# Patient Record
Sex: Female | Born: 1970 | Race: White | Hispanic: No | Smoking: Never smoker
Health system: Southern US, Community
[De-identification: ages and names within clinical notes are randomized; demographics above are authoritative.]

## PROBLEM LIST (undated history)

## (undated) DIAGNOSIS — J40 Bronchitis, not specified as acute or chronic: Secondary | ICD-10-CM

## (undated) DIAGNOSIS — J45909 Unspecified asthma, uncomplicated: Secondary | ICD-10-CM

## (undated) DIAGNOSIS — L03119 Cellulitis of unspecified part of limb: Secondary | ICD-10-CM

## (undated) DIAGNOSIS — E079 Disorder of thyroid, unspecified: Secondary | ICD-10-CM

## (undated) DIAGNOSIS — I10 Essential (primary) hypertension: Secondary | ICD-10-CM

## (undated) HISTORY — PX: CHOLECYSTECTOMY: SHX55

---

## 2015-05-07 ENCOUNTER — Encounter: Payer: Self-pay | Admitting: Emergency Medicine

## 2015-05-07 ENCOUNTER — Emergency Department: Payer: Self-pay

## 2015-05-07 ENCOUNTER — Emergency Department
Admission: EM | Admit: 2015-05-07 | Discharge: 2015-05-07 | Disposition: A | Discharge status: Home / Self Care | Payer: Self-pay | Attending: Emergency Medicine | Admitting: Emergency Medicine

## 2015-05-07 DIAGNOSIS — L03115 Cellulitis of right lower limb: Secondary | ICD-10-CM | POA: Insufficient documentation

## 2015-05-07 DIAGNOSIS — J45901 Unspecified asthma with (acute) exacerbation: Secondary | ICD-10-CM | POA: Insufficient documentation

## 2015-05-07 DIAGNOSIS — J40 Bronchitis, not specified as acute or chronic: Secondary | ICD-10-CM

## 2015-05-07 DIAGNOSIS — Z880 Allergy status to penicillin: Secondary | ICD-10-CM | POA: Insufficient documentation

## 2015-05-07 DIAGNOSIS — L03116 Cellulitis of left lower limb: Secondary | ICD-10-CM | POA: Insufficient documentation

## 2015-05-07 DIAGNOSIS — Z3202 Encounter for pregnancy test, result negative: Secondary | ICD-10-CM | POA: Insufficient documentation

## 2015-05-07 DIAGNOSIS — R609 Edema, unspecified: Secondary | ICD-10-CM

## 2015-05-07 DIAGNOSIS — I10 Essential (primary) hypertension: Secondary | ICD-10-CM | POA: Insufficient documentation

## 2015-05-07 HISTORY — DX: Unspecified asthma, uncomplicated: J45.909

## 2015-05-07 HISTORY — DX: Disorder of thyroid, unspecified: E07.9

## 2015-05-07 HISTORY — DX: Essential (primary) hypertension: I10

## 2015-05-07 LAB — BASIC METABOLIC PANEL
ANION GAP: 8 (ref 5–15)
BUN: 18 mg/dL (ref 6–20)
CHLORIDE: 102 mmol/L (ref 101–111)
CO2: 31 mmol/L (ref 22–32)
Calcium: 9.6 mg/dL (ref 8.9–10.3)
Creatinine, Ser: 1.25 mg/dL — ABNORMAL HIGH (ref 0.44–1.00)
GFR, EST AFRICAN AMERICAN: 60 mL/min — AB (ref >60)
GFR, EST NON AFRICAN AMERICAN: 52 mL/min — AB (ref >60)
Glucose, Bld: 134 mg/dL — ABNORMAL HIGH (ref 65–99)
POTASSIUM: 3.6 mmol/L (ref 3.5–5.1)
SODIUM: 141 mmol/L (ref 135–145)

## 2015-05-07 LAB — CBC WITH DIFFERENTIAL/PLATELET
BAND NEUTROPHILS: 3 %
BLASTS: 0 %
Basophils Absolute: 0 10*3/uL (ref 0–0.1)
Basophils Relative: 0 %
EOS ABS: 0.1 10*3/uL (ref 0–0.7)
Eosinophils Relative: 1 %
HCT: 37.1 % (ref 35.0–47.0)
HEMOGLOBIN: 11.9 g/dL — AB (ref 12.0–16.0)
LYMPHS PCT: 29 %
Lymphs Abs: 2.9 10*3/uL (ref 1.0–3.6)
MCH: 32.6 pg (ref 26.0–34.0)
MCHC: 32.1 g/dL (ref 32.0–36.0)
MCV: 101.5 fL — ABNORMAL HIGH (ref 80.0–100.0)
MYELOCYTES: 1 %
Metamyelocytes Relative: 2 %
Monocytes Absolute: 0.4 10*3/uL (ref 0.2–0.9)
Monocytes Relative: 4 %
NEUTROS PCT: 60 %
Neutro Abs: 6.6 10*3/uL — ABNORMAL HIGH (ref 1.4–6.5)
OTHER: 0 %
PROMYELOCYTES ABS: 0 %
Platelets: 209 10*3/uL (ref 150–440)
RBC: 3.65 MIL/uL — ABNORMAL LOW (ref 3.80–5.20)
RDW: 15.1 % — ABNORMAL HIGH (ref 11.5–14.5)
WBC: 10 10*3/uL (ref 3.6–11.0)
nRBC: 0 /100 WBC

## 2015-05-07 LAB — POCT PREGNANCY, URINE: PREG TEST UR: NEGATIVE

## 2015-05-07 LAB — TROPONIN I

## 2015-05-07 LAB — BRAIN NATRIURETIC PEPTIDE: B NATRIURETIC PEPTIDE 5: 108 pg/mL — AB (ref 0.0–100.0)

## 2015-05-07 MED ORDER — IOHEXOL 350 MG/ML SOLN
100.0000 mL | Freq: Once | INTRAVENOUS | Status: AC | PRN
  Administered 2015-05-07: 100 mL via INTRAVENOUS

## 2015-05-07 MED ORDER — IPRATROPIUM-ALBUTEROL 0.5-2.5 (3) MG/3ML IN SOLN
3.0000 mL | Freq: Once | RESPIRATORY_TRACT | Status: AC
  Administered 2015-05-07: 3 mL via RESPIRATORY_TRACT
  Filled 2015-05-07: qty 3

## 2015-05-07 MED ORDER — FUROSEMIDE 20 MG PO TABS
40.0000 mg | ORAL_TABLET | Freq: Every day | ORAL | Status: DC
Start: 2015-05-07 — End: 2015-05-19

## 2015-05-07 MED ORDER — IBUPROFEN 600 MG PO TABS
600.0000 mg | ORAL_TABLET | Freq: Once | ORAL | Status: AC
  Administered 2015-05-07: 600 mg via ORAL
  Filled 2015-05-07: qty 1

## 2015-05-07 MED ORDER — ALBUTEROL SULFATE HFA 108 (90 BASE) MCG/ACT IN AERS: 2.0000 | INHALATION_SPRAY | Freq: Four times a day (QID) | RESPIRATORY_TRACT | Status: DC | PRN

## 2015-05-07 MED ORDER — SULFAMETHOXAZOLE-TRIMETHOPRIM 800-160 MG PO TABS
1.0000 | ORAL_TABLET | Freq: Two times a day (BID) | ORAL | Status: DC
Start: 2015-05-07 — End: 2015-08-06

## 2015-05-07 MED ORDER — SULFAMETHOXAZOLE-TRIMETHOPRIM 800-160 MG PO TABS
1.0000 | ORAL_TABLET | Freq: Once | ORAL | Status: AC
  Administered 2015-05-07: 1 via ORAL
  Filled 2015-05-07: qty 1

## 2015-05-07 NOTE — ED Notes (Addendum)
Says she has swelling on both legs, but different on right side.  Also dry mouth.  Says right leg has heat in it. And swelling is spreading up to her belly.

## 2015-05-07 NOTE — ED Provider Notes (Addendum)
[facility]  I accepted care from Dr. Alphonzo Lemmings ____________________________________________ ECG read and interpreted by myself, Dr. Governor Rooks M.D. 62 bpm. Normal sinus rhythm. Nonspecific intraventricular conduction delay. Right axis deviation. Nonspecific T-wave     LABS (pertinent positives/negatives)  White blood count 10.0, hemoglobin 1.9 and platelet count 29 Basic medical panel significant for creatinine 1.25 and otherwise without significant abnormality BNP 108 and troponin less than 0.03   ____________________________________________    RADIOLOGY All xrays were viewed by me. Imaging interpreted by radiologist.  Chest 2 view:   IMPRESSION: 1. Mild diffuse peribronchial cuffing suggestive of acute bronchitis. 2. Mild cardiomegaly.  CT for PE:  IMPRESSION: Negative for pulmonary embolus. No acute cardiopulmonary disease.  Mild cardiomegaly.  Fatty infiltration liver. ____________________________________________   PROCEDURES  Procedure(s) performed: None  Critical Care performed: None  ____________________________________________   INITIAL IMPRESSION / ASSESSMENT AND PLAN / ED COURSE   Pertinent labs & imaging results that were available during my care of the patient were reviewed by me and considered in my medical decision making (see chart for details).   Although patient's symptoms did seem likely that be due to volume overload from CHF, she has no evidence of CHF on chest x-ray, no hypoxia, and her BNP is negative. She gives history that is more consistent with peripheral edema and bronchitis. I will go ahead and give her DuoNeb treatment here. Because she states it does dyspnea is very bad and out of proportion to her lung findings, I am did obtain a chest CT to rule out pulmonary embolism.  I was able to get the patient into rooms like examine her legs privately, and there is erythema, concerning for early cellulitis  bilateral shins. I will place her on Bactrim, as she requested a medication off the $4 list because she does not have insurance coverage. Her lungs feel a little bit better after albuterol, and will prescribe her an inhaler. Given that she does not have a history of asthma, and she feels a little better with the inhaler, I am going to hold off on any prednisone/steroids.  For her lower extremity edema, no evidence of CHF, I will prescribe 3 days of Lasix to help with diuresis.  She has no PCP, and I did refer her to see either [place] [facility] or [place] [facility].  CONSULTATIONS: None  Patient / Family / Caregiver informed of clinical course, medical decision-making process, and agree with plan.   I discussed return precautions, follow-up instructions, and discharged instructions with patient and/or family.     ____________________________________________   FINAL CLINICAL IMPRESSION(S) / ED DIAGNOSES  Final diagnoses:  Peripheral edema  Bronchitis  Cellulitis of leg without foot, left  Cellulitis of right leg without foot        Governor Rooks, MD 05/07/15 1835  Governor Rooks, MD 05/07/15 1956

## 2015-05-07 NOTE — ED Provider Notes (Signed)
[facility] Emergency Department Provider Note  ____________________________________________   I have reviewed the triage vital signs and the nursing notes.   HISTORY  Chief Complaint Leg Swelling    HPI Connie Spencer is a 45 y.o. female presents today complaining of shortness of breath. She also states she has bilateral leg swelling. She also states that her right pretibial region somewhat erythematous. She denies any fever or chills. She denies any nausea or vomiting. She denies any chest pain. Patient is morbidly obese. She states that she has had exertional dyspnea over the last 2-3 days. Her leg swelling is been worse over the last week. She used to be on a fluid pill but no longer is. She is not compliant with her medication. As of a history of hypertension thyroid and asthma. She states she's never been diagnosed with CHF however. She denies any cough. Nothing makes the swelling worsen nothing makes it better. She has had no cough. She denies any fever or history of PE or DVT or recent travel. Positive orthopnea.  Past Medical History  Diagnosis Date  . Hypertension   . Thyroid disease   . Asthma     There are no active problems to display for this patient.   Past Surgical History  Procedure Laterality Date  . Cholecystectomy      No current outpatient prescriptions on file.  Allergies Aspirin and Penicillins  No family history on file.  Social History Social History  Substance Use Topics  . Smoking status: Never Smoker   . Smokeless tobacco: None  . Alcohol Use: No    Review of Systems Constitutional: No fever/chills Eyes: No visual changes. ENT: No sore throat. No stiff neck no neck pain Cardiovascular: Denies chest pain. Respiratory: Positive shortness of breath. Positive orthopnea Gastrointestinal:   no vomiting.  No diarrhea.  No constipation. Genitourinary: Negative for dysuria. Musculoskeletal: Positive bilateral lower  extremity swelling Skin: Positive mild redness noted to the anterior part of the right leg she states Neurological: Negative for headaches, focal weakness or numbness. 10-point ROS otherwise negative.  ____________________________________________   PHYSICAL EXAM:  VITAL SIGNS: ED Triage Vitals  Enc Vitals Group     BP 05/07/15 1052 154/88 mmHg     Pulse Rate 05/07/15 1052 80     Resp 05/07/15 1052 18     Temp 05/07/15 1052 98 F (36.7 C)     Temp Source 05/07/15 1052 Oral     SpO2 05/07/15 1052 98 %     Weight 05/07/15 1052 300 lb (136.079 kg)     Height 05/07/15 1052 5' (1.524 m)     Head Cir --      Peak Flow --      Pain Score 05/07/15 1115 5     Pain Loc --      Pain Edu? --      Excl. in GC? --     Constitutional: Alert and oriented. Well appearing and in no acute distress. Eyes: Conjunctivae are normal. PERRL. EOMI. Head: Atraumatic. Nose: No congestion/rhinnorhea. Mouth/Throat: Mucous membranes are moist.  Oropharynx non-erythematous. Neck: No stridor.   Nontender with no meningismus Cardiovascular: Normal rate, regular rhythm. Grossly normal heart sounds.  Good peripheral circulation. Respiratory: Normal respiratory effort.  No retractions. Diminished in the bases with occasional rale Abdominal: Soft and nontender. No distention. No guarding no rebound Back:  There is no focal tenderness or step off there is no midline tenderness there are no lesions noted. there is  no CVA tenderness Musculoskeletal: No lower extremity tenderness. No joint effusions, no DVT signs strong distal pulses 3+ pitting bilateral symmetric edema with mild erythema noted to the pretibial region on the right leg Neurologic:  Normal speech and language. No gross focal neurologic deficits are appreciated.  Skin:  Skin is warm, dry and intact. Mild pretibial erythema with no fluctuance noted to the right Psychiatric: Mood and affect are normal. Speech and behavior are  normal.  ____________________________________________   LABS (all labs ordered are listed, but only abnormal results are displayed)  Labs Reviewed  CBC WITH DIFFERENTIAL/PLATELET  BASIC METABOLIC PANEL  BRAIN NATRIURETIC PEPTIDE  TROPONIN I   ____________________________________________  EKG  I personally interpreted any EKGs ordered by me or triage  ____________________________________________  RADIOLOGY  I reviewed any imaging ordered by me or triage that were performed during my shift ____________________________________________   PROCEDURES  Procedure(s) performed: None  Critical Care performed: None  ____________________________________________   INITIAL IMPRESSION / ASSESSMENT AND PLAN / ED COURSE  Pertinent labs & imaging results that were available during my care of the patient were reviewed by me and considered in my medical decision making (see chart for details).  Patient seen at the end of my shift and signed out prior to results. She has CHF symptoms with bilateral lower extremity swelling and edema and orthopnea and shortness of breath. She states she gets winded just moving around her house. She got short of breath just moving in the bed. Fortunately her oxygen saturation is reassuring. Low suspicion at this time for bilateral DVT or PE, patient used to be on a fluid pill and has significant pitting edema bilaterally. She likely will need to be admitted for echocardiogram and diuresis. There is some mild erythema to the right pretibial region. This will need better investigated when the patient is in a room and can be better addressed. At this time she is in the hallway. Signed out to Dr. Shaune Pollack at the end of my shift. ____________________________________________   FINAL CLINICAL IMPRESSION(S) / ED DIAGNOSES  Final diagnoses:  None      This chart was dictated using voice recognition software.  Despite best efforts to proofread,  errors can occur  which can change meaning.     Jeanmarie Plant, MD 05/07/15 1534

## 2015-05-07 NOTE — Discharge Instructions (Signed)
You were evaluated for shortness of breath and leg swelling with redness, and are being treated for bronchitis with an inhaler, peripheral/leg edema with furosemide, and early skin infection called cellulitis with Bactrim.  Return to the emergency department for any new or worsening condition including trouble breathing, shortness breath, fever, coughing, dizziness, weakness or numbness, fever, worsening leg swelling or redness, or any other symptoms concerning to you.   Cellulitis Cellulitis is an infection of the skin and the tissue beneath it. The infected area is usually red and tender. Cellulitis occurs most often in the arms and lower legs.  CAUSES  Cellulitis is caused by bacteria that enter the skin through cracks or cuts in the skin. The most common types of bacteria that cause cellulitis are staphylococci and streptococci. SIGNS AND SYMPTOMS   Redness and warmth.  Swelling.  Tenderness or pain.  Fever. DIAGNOSIS  Your health care provider can usually determine what is wrong based on a physical exam. Blood tests may also be done. TREATMENT  Treatment usually involves taking an antibiotic medicine. HOME CARE INSTRUCTIONS   Take your antibiotic medicine as directed by your health care provider. Finish the antibiotic even if you start to feel better.  Keep the infected arm or leg elevated to reduce swelling.  Apply a warm cloth to the affected area up to 4 times per day to relieve pain.  Take medicines only as directed by your health care provider.  Keep all follow-up visits as directed by your health care provider. SEEK MEDICAL CARE IF:   You notice red streaks coming from the infected area.  Your red area gets larger or turns dark in color.  Your bone or joint underneath the infected area becomes painful after the skin has healed.  Your infection returns in the same area or another area.  You notice a swollen bump in the infected area.  You develop new  symptoms.  You have a fever. SEEK IMMEDIATE MEDICAL CARE IF:   You feel very sleepy.  You develop vomiting or diarrhea.  You have a general ill feeling (malaise) with muscle aches and pains.   This information is not intended to replace advice given to you by your health care provider. Make sure you discuss any questions you have with your health care provider.   Document Released: 12/02/2004 Document Revised: 11/13/2014 Document Reviewed: 05/10/2011 Elsevier Interactive Patient Education 2016 Elsevier Inc.  Edema Edema is an abnormal buildup of fluids in your bodytissues. Edema is somewhatdependent on gravity to pull the fluid to the lowest place in your body. That makes the condition more common in the legs and thighs (lower extremities). Painless swelling of the feet and ankles is common and becomes more likely as you get older. It is also common in looser tissues, like around your eyes.  When the affected area is squeezed, the fluid may move out of that spot and leave a dent for a few moments. This dent is called pitting.  CAUSES  There are many possible causes of edema. Eating too much salt and being on your feet or sitting for a long time can cause edema in your legs and ankles. Hot weather may make edema worse. Common medical causes of edema include:  Heart failure.  Liver disease.  Kidney disease.  Weak blood vessels in your legs.  Cancer.  An injury.  Pregnancy.  Some medications.  Obesity. SYMPTOMS  Edema is usually painless.Your skin may look swollen or shiny.  DIAGNOSIS  Your health  care provider may be able to diagnose edema by asking about your medical history and doing a physical exam. You may need to have tests such as X-rays, an electrocardiogram, or blood tests to check for medical conditions that may cause edema.  TREATMENT  Edema treatment depends on the cause. If you have heart, liver, or kidney disease, you need the treatment appropriate for  these conditions. General treatment may include:  Elevation of the affected body part above the level of your heart.  Compression of the affected body part. Pressure from elastic bandages or support stockings squeezes the tissues and forces fluid back into the blood vessels. This keeps fluid from entering the tissues.  Restriction of fluid and salt intake.  Use of a water pill (diuretic). These medications are appropriate only for some types of edema. They pull fluid out of your body and make you urinate more often. This gets rid of fluid and reduces swelling, but diuretics can have side effects. Only use diuretics as directed by your health care provider. HOME CARE INSTRUCTIONS   Keep the affected body part above the level of your heart when you are lying down.   Do not sit still or stand for prolonged periods.   Do not put anything directly under your knees when lying down.  Do not wear constricting clothing or garters on your upper legs.   Exercise your legs to work the fluid back into your blood vessels. This may help the swelling go down.   Wear elastic bandages or support stockings to reduce ankle swelling as directed by your health care provider.   Eat a low-salt diet to reduce fluid if your health care provider recommends it.   Only take medicines as directed by your health care provider. SEEK MEDICAL CARE IF:   Your edema is not responding to treatment.  You have heart, liver, or kidney disease and notice symptoms of edema.  You have edema in your legs that does not improve after elevating them.   You have sudden and unexplained weight gain. SEEK IMMEDIATE MEDICAL CARE IF:   You develop shortness of breath or chest pain.   You cannot breathe when you lie down.  You develop pain, redness, or warmth in the swollen areas.   You have heart, liver, or kidney disease and suddenly get edema.  You have a fever and your symptoms suddenly get worse. MAKE SURE  YOU:   Understand these instructions.  Will watch your condition.  Will get help right away if you are not doing well or get worse.   This information is not intended to replace advice given to you by your health care provider. Make sure you discuss any questions you have with your health care provider.   Document Released: 02/22/2005 Document Revised: 03/15/2014 Document Reviewed: 12/15/2012 Elsevier Interactive Patient Education 2016 Elsevier Inc. Acute Bronchitis Bronchitis is inflammation of the airways that extend from the windpipe into the lungs (bronchi). The inflammation often causes mucus to develop. This leads to a cough, which is the most common symptom of bronchitis.  In acute bronchitis, the condition usually develops suddenly and goes away over time, usually in a couple weeks. Smoking, allergies, and asthma can make bronchitis worse. Repeated episodes of bronchitis may cause further lung problems.  CAUSES Acute bronchitis is most often caused by the same virus that causes a cold. The virus can spread from person to person (contagious) through coughing, sneezing, and touching contaminated objects. SIGNS AND SYMPTOMS   Cough.  Fever.   Coughing up mucus.   Body aches.   Chest congestion.   Chills.   Shortness of breath.   Sore throat.  DIAGNOSIS  Acute bronchitis is usually diagnosed through a physical exam. Your health care provider will also ask you questions about your medical history. Tests, such as chest X-rays, are sometimes done to rule out other conditions.  TREATMENT  Acute bronchitis usually goes away in a couple weeks. Oftentimes, no medical treatment is necessary. Medicines are sometimes given for relief of fever or cough. Antibiotic medicines are usually not needed but may be prescribed in certain situations. In some cases, an inhaler may be recommended to help reduce shortness of breath and control the cough. A cool mist vaporizer may also be  used to help thin bronchial secretions and make it easier to clear the chest.  HOME CARE INSTRUCTIONS  Get plenty of rest.   Drink enough fluids to keep your urine clear or pale yellow (unless you have a medical condition that requires fluid restriction). Increasing fluids may help thin your respiratory secretions (sputum) and reduce chest congestion, and it will prevent dehydration.   Take medicines only as directed by your health care provider.  If you were prescribed an antibiotic medicine, finish it all even if you start to feel better.  Avoid smoking and secondhand smoke. Exposure to cigarette smoke or irritating chemicals will make bronchitis worse. If you are a smoker, consider using nicotine gum or skin patches to help control withdrawal symptoms. Quitting smoking will help your lungs heal faster.   Reduce the chances of another bout of acute bronchitis by washing your hands frequently, avoiding people with cold symptoms, and trying not to touch your hands to your mouth, nose, or eyes.   Keep all follow-up visits as directed by your health care provider.  SEEK MEDICAL CARE IF: Your symptoms do not improve after 1 week of treatment.  SEEK IMMEDIATE MEDICAL CARE IF:  You develop an increased fever or chills.   You have chest pain.   You have severe shortness of breath.  You have bloody sputum.   You develop dehydration.  You faint or repeatedly feel like you are going to pass out.  You develop repeated vomiting.  You develop a severe headache. MAKE SURE YOU:   Understand these instructions.  Will watch your condition.  Will get help right away if you are not doing well or get worse.   This information is not intended to replace advice given to you by your health care provider. Make sure you discuss any questions you have with your health care provider.   Document Released: 04/01/2004 Document Revised: 03/15/2014 Document Reviewed: 08/15/2012 Elsevier  Interactive Patient Education [employer]

## 2015-05-07 NOTE — ED Notes (Signed)
Patient transported to CT 

## 2015-05-08 ENCOUNTER — Telehealth: Payer: Self-pay | Admitting: Emergency Medicine

## 2015-05-19 ENCOUNTER — Emergency Department: Payer: Self-pay

## 2015-05-19 ENCOUNTER — Emergency Department
Admission: EM | Admit: 2015-05-19 | Discharge: 2015-05-19 | Disposition: A | Discharge status: Home / Self Care | Payer: Self-pay | Attending: Emergency Medicine | Admitting: Emergency Medicine

## 2015-05-19 ENCOUNTER — Encounter: Payer: Self-pay | Admitting: Emergency Medicine

## 2015-05-19 DIAGNOSIS — I10 Essential (primary) hypertension: Secondary | ICD-10-CM | POA: Insufficient documentation

## 2015-05-19 DIAGNOSIS — R609 Edema, unspecified: Secondary | ICD-10-CM

## 2015-05-19 DIAGNOSIS — L03119 Cellulitis of unspecified part of limb: Secondary | ICD-10-CM | POA: Insufficient documentation

## 2015-05-19 DIAGNOSIS — Z880 Allergy status to penicillin: Secondary | ICD-10-CM | POA: Insufficient documentation

## 2015-05-19 DIAGNOSIS — R600 Localized edema: Secondary | ICD-10-CM | POA: Insufficient documentation

## 2015-05-19 DIAGNOSIS — J45901 Unspecified asthma with (acute) exacerbation: Secondary | ICD-10-CM | POA: Insufficient documentation

## 2015-05-19 LAB — CBC
HEMATOCRIT: 33.7 % — AB (ref 35.0–47.0)
HEMOGLOBIN: 11.1 g/dL — AB (ref 12.0–16.0)
MCH: 33.7 pg (ref 26.0–34.0)
MCHC: 32.9 g/dL (ref 32.0–36.0)
MCV: 102.3 fL — ABNORMAL HIGH (ref 80.0–100.0)
Platelets: 286 10*3/uL (ref 150–440)
RBC: 3.29 MIL/uL — ABNORMAL LOW (ref 3.80–5.20)
RDW: 15.5 % — AB (ref 11.5–14.5)
WBC: 9.7 10*3/uL (ref 3.6–11.0)

## 2015-05-19 LAB — COMPREHENSIVE METABOLIC PANEL
ALBUMIN: 4.4 g/dL (ref 3.5–5.0)
ALT: 117 U/L — ABNORMAL HIGH (ref 14–54)
AST: 103 U/L — AB (ref 15–41)
Alkaline Phosphatase: 320 U/L — ABNORMAL HIGH (ref 38–126)
Anion gap: 8 (ref 5–15)
BILIRUBIN TOTAL: 0.7 mg/dL (ref 0.3–1.2)
BUN: 32 mg/dL — AB (ref 6–20)
CO2: 30 mmol/L (ref 22–32)
Calcium: 9.6 mg/dL (ref 8.9–10.3)
Chloride: 98 mmol/L — ABNORMAL LOW (ref 101–111)
Creatinine, Ser: 1.6 mg/dL — ABNORMAL HIGH (ref 0.44–1.00)
GFR calc Af Amer: 44 mL/min — ABNORMAL LOW (ref >60)
GFR calc non Af Amer: 38 mL/min — ABNORMAL LOW (ref >60)
GLUCOSE: 192 mg/dL — AB (ref 65–99)
POTASSIUM: 4.2 mmol/L (ref 3.5–5.1)
Sodium: 136 mmol/L (ref 135–145)
TOTAL PROTEIN: 8.3 g/dL — AB (ref 6.5–8.1)

## 2015-05-19 LAB — TROPONIN I: Troponin I: 0.03 ng/mL (ref <0.031)

## 2015-05-19 MED ORDER — POTASSIUM CHLORIDE ER 10 MEQ PO TBCR: 10.0000 meq | EXTENDED_RELEASE_TABLET | Freq: Every day | ORAL | Status: DC

## 2015-05-19 MED ORDER — FUROSEMIDE 10 MG/ML IJ SOLN
60.0000 mg | Freq: Once | INTRAMUSCULAR | Status: AC
  Administered 2015-05-19: 60 mg via INTRAVENOUS
  Filled 2015-05-19: qty 8

## 2015-05-19 MED ORDER — FUROSEMIDE 40 MG PO TABS: 40.0000 mg | ORAL_TABLET | Freq: Every day | ORAL | Status: DC

## 2015-05-19 MED ORDER — HYDROCODONE-ACETAMINOPHEN 5-325 MG PO TABS: 1.0000 | ORAL_TABLET | ORAL | Status: DC | PRN

## 2015-05-19 MED ORDER — MORPHINE SULFATE (PF) 4 MG/ML IV SOLN
4.0000 mg | Freq: Once | INTRAVENOUS | Status: AC
  Administered 2015-05-19: 4 mg via INTRAVENOUS
  Filled 2015-05-19: qty 1

## 2015-05-19 MED ORDER — CEPHALEXIN 500 MG PO CAPS: 500.0000 mg | ORAL_CAPSULE | Freq: Three times a day (TID) | ORAL | Status: DC

## 2015-05-19 MED ORDER — ONDANSETRON HCL 4 MG/2ML IJ SOLN
4.0000 mg | Freq: Once | INTRAMUSCULAR | Status: AC
  Administered 2015-05-19: 4 mg via INTRAVENOUS
  Filled 2015-05-19: qty 2

## 2015-05-19 NOTE — ED Notes (Signed)
AAOx3.  Skin warm and dry.  NAD 

## 2015-05-19 NOTE — ED Notes (Addendum)
Pt presents with bilateral lower leg swelling since last week, has been seen here for same recently. Also c/o shortness of breath.

## 2015-05-19 NOTE — ED Notes (Signed)
Says leg swelling since she was here last  Time.  Says she cannot get a doctor due to her friend she llives with will not sign papers for [facility].  Says no change in redness.  Pt has very swollen legs bilaterally.  Pedal pulses bilaterally are good.  Denies  Sob right now. lungd sound cleasr

## 2015-05-19 NOTE — ED Notes (Signed)
2 L O2 placed on patient.

## 2015-05-19 NOTE — ED Notes (Signed)
Patient to US via stretcher.  AAOx3.  Skin warm and dry.  No SOB/ DOE.

## 2015-05-19 NOTE — Discharge Instructions (Signed)
Cellulitis °Cellulitis is an infection of the skin and the tissue beneath it. The infected area is usually red and tender. Cellulitis occurs most often in the arms and lower legs.  °CAUSES  °Cellulitis is caused by bacteria that enter the skin through cracks or cuts in the skin. The most common types of bacteria that cause cellulitis are staphylococci and streptococci. °SIGNS AND SYMPTOMS  °· Redness and warmth. °· Swelling. °· Tenderness or pain. °· Fever. °DIAGNOSIS  °Your health care provider can usually determine what is wrong based on a physical exam. Blood tests may also be done. °TREATMENT  °Treatment usually involves taking an antibiotic medicine. °HOME CARE INSTRUCTIONS  °· Take your antibiotic medicine as directed by your health care provider. Finish the antibiotic even if you start to feel better. °· Keep the infected arm or leg elevated to reduce swelling. °· Apply a warm cloth to the affected area up to 4 times per day to relieve pain. °· Take medicines only as directed by your health care provider. °· Keep all follow-up visits as directed by your health care provider. °SEEK MEDICAL CARE IF:  °· You notice red streaks coming from the infected area. °· Your red area gets larger or turns dark in color. °· Your bone or joint underneath the infected area becomes painful after the skin has healed. °· Your infection returns in the same area or another area. °· You notice a swollen bump in the infected area. °· You develop new symptoms. °· You have a fever. °SEEK IMMEDIATE MEDICAL CARE IF:  °· You feel very sleepy. °· You develop vomiting or diarrhea. °· You have a general ill feeling (malaise) with muscle aches and pains. °  °This information is not intended to replace advice given to you by your health care provider. Make sure you discuss any questions you have with your health care provider. °  °Document Released: 12/02/2004 Document Revised: 11/13/2014 Document Reviewed: 05/10/2011 °Elsevier Interactive  Patient Education ©2016 Elsevier Inc. ° °Peripheral Edema °You have swelling in your legs (peripheral edema). This swelling is due to excess accumulation of salt and water in your body. Edema may be a sign of heart, kidney or liver disease, or a side effect of a medication. It may also be due to problems in the leg veins. Elevating your legs and using special support stockings may be very helpful, if the cause of the swelling is due to poor venous circulation. Avoid long periods of standing, whatever the cause. °Treatment of edema depends on identifying the cause. Chips, pretzels, pickles and other salty foods should be avoided. Restricting salt in your diet is almost always needed. Water pills (diuretics) are often used to remove the excess salt and water from your body via urine. These medicines prevent the kidney from reabsorbing sodium. This increases urine flow. °Diuretic treatment may also result in lowering of potassium levels in your body. Potassium supplements may be needed if you have to use diuretics daily. Daily weights can help you keep track of your progress in clearing your edema. You should call your caregiver for follow up care as recommended. °SEEK IMMEDIATE MEDICAL CARE IF:  °· You have increased swelling, pain, redness, or heat in your legs. °· You develop shortness of breath, especially when lying down. °· You develop chest or abdominal pain, weakness, or fainting. °· You have a fever. °  °This information is not intended to replace advice given to you by your health care provider. Make sure you discuss   any questions you have with your health care provider. °  °Document Released: 04/01/2004 Document Revised: 05/17/2011 Document Reviewed: 09/04/2014 °Elsevier Interactive Patient Education ©2016 Elsevier Inc. ° °

## 2015-05-19 NOTE — ED Notes (Addendum)
Patient discharged 1405.

## 2015-05-19 NOTE — ED Provider Notes (Signed)
[facility] Emergency Department Provider Note  Time seen: 11:53 AM  I have reviewed the triage vital signs and the nursing notes.   HISTORY  Chief Complaint Leg Swelling    HPI Connie Spencer is a 45 y.o. female with a past medical history of hypertension, asthma presents the emergency department with lower extremity swelling of both legs, with mild shortness breath. According to the patient for the past several weeks she has had increased lower extremity edema and tenderness to her lower extremities. Also complaining of several weeks of shortness of breath. Patient was seen in the emergency department for the same complaint 10 days ago at that time had a negative workup including a CT angiography of the chest. Patient states she is supposed to be on Lasix but could not afford it so she has been off of it for many years. Patient states she no longer has a primary care physician due to financial reasons. Denies any chest pain. States her shortness of breath is mild. Leg pain is moderate to severe dull aching pain in both lower extremities.     Past Medical History  Diagnosis Date  . Hypertension   . Thyroid disease   . Asthma     There are no active problems to display for this patient.   Past Surgical History  Procedure Laterality Date  . Cholecystectomy      Current Outpatient Rx  Name  Route  Sig  Dispense  Refill  . albuterol (PROVENTIL HFA;VENTOLIN HFA) 108 (90 Base) MCG/ACT inhaler   Inhalation   Inhale 2 puffs into the lungs every 6 (six) hours as needed for wheezing or shortness of breath.   1 Inhaler   0   . furosemide (LASIX) 20 MG tablet   Oral   Take 2 tablets (40 mg total) by mouth daily. Patient not taking: Reported on 05/19/2015   3 tablet   0   . sulfamethoxazole-trimethoprim (BACTRIM DS,SEPTRA DS) 800-160 MG tablet   Oral   Take 1 tablet by mouth 2 (two) times daily. Patient not taking: Reported on 05/19/2015   20 tablet   0      Allergies Aspirin and Penicillins  No family history on file.  Social History Social History  Substance Use Topics  . Smoking status: Never Smoker   . Smokeless tobacco: None  . Alcohol Use: No    Review of Systems Constitutional: Negative for fever. Cardiovascular: Negative for chest pain. Respiratory: Positive for shortness of breath. Gastrointestinal: Negative for abdominal pain Musculoskeletal: Positive for bilateral lower extremity swelling and pain. Skin: Mild redness of bilateral lower extremities Neurological: Negative for headache 10-point ROS otherwise negative.  ____________________________________________   PHYSICAL EXAM:  VITAL SIGNS: ED Triage Vitals  Enc Vitals Group     BP 05/19/15 1029 141/80 mmHg     Pulse Rate 05/19/15 1029 74     Resp 05/19/15 1029 20     Temp 05/19/15 1029 97.9 F (36.6 C)     Temp Source 05/19/15 1029 Oral     SpO2 05/19/15 1029 90 %     Weight 05/19/15 1029 300 lb (136.079 kg)     Height 05/19/15 1029 5' (1.524 m)     Head Cir --      Peak Flow --      Pain Score 05/19/15 1031 8     Pain Loc --      Pain Edu? --      Excl. in GC? --  Constitutional: Alert and oriented. Well appearing and in no distress. Eyes: Normal exam ENT   Head: Normocephalic and atraumatic.   Mouth/Throat: Mucous membranes are moist. Cardiovascular: Normal rate, regular rhythm.  Respiratory: Normal respiratory effort without tachypnea nor retractions. Breath sounds are clear  Gastrointestinal: Soft and nontender. No distention.  Musculoskeletal: Patient with 2-3+ lower extremity edema bilateral lower extremities. Mild erythema. Moderate tenderness palpation bilateral lower extremities. Neurologic:  Normal speech and language. No gross focal neurologic deficits  Skin:  Skin is warm, dry and intact.  Psychiatric: Mood and affect are normal. Speech and behavior are normal.    ____________________________________________   RADIOLOGY  Ultrasound negative for DVT Chest x-ray negative  ____________________________________________   INITIAL IMPRESSION / ASSESSMENT AND PLAN / ED COURSE  Pertinent labs & imaging results that were available during my care of the patient were reviewed by me and considered in my medical decision making (see chart for details).  Patient with bilateral lower extremity edema, mild shortness of breath. Patient is a moderate tenderness palpation of the bilateral lower extremities with mild erythema, suggestive of possible cellulitis versus pain from chronic peripheral edema. Patient states she is supposed to be on Lasix chronically but cannot afford it so she has been off of it for many years. Patient was given 3 days of Lasix during her last visit, states she took them but it did not help. We will check labs, ultrasound of bilateral lower extremities, chest x-ray. Overall the patient appears well, no distress.  Ultrasound negative for DVT. Chest x-ray shows no acute findings. We'll place the patient on Lasix, as well as cover with Keflex for possible cellulitis. We'll place the patient on short course of pain medication and referred to [facility]. Patient agreeable to this plan.  ____________________________________________   FINAL CLINICAL IMPRESSION(S) / ED DIAGNOSES  Peripheral edema Cellulitis   Minna Antis, MD 05/19/15 1258

## 2015-08-02 ENCOUNTER — Emergency Department: Payer: Self-pay

## 2015-08-02 ENCOUNTER — Inpatient Hospital Stay
Admission: EM | Admit: 2015-08-02 | Discharge: 2015-08-06 | DRG: 291 | Disposition: A | Discharge status: Home / Self Care | Payer: Self-pay | Attending: Internal Medicine | Admitting: Internal Medicine

## 2015-08-02 ENCOUNTER — Encounter: Payer: Self-pay | Admitting: Emergency Medicine

## 2015-08-02 DIAGNOSIS — J449 Chronic obstructive pulmonary disease, unspecified: Secondary | ICD-10-CM | POA: Diagnosis present

## 2015-08-02 DIAGNOSIS — I130 Hypertensive heart and chronic kidney disease with heart failure and stage 1 through stage 4 chronic kidney disease, or unspecified chronic kidney disease: Principal | ICD-10-CM | POA: Diagnosis present

## 2015-08-02 DIAGNOSIS — J440 Chronic obstructive pulmonary disease with acute lower respiratory infection: Secondary | ICD-10-CM | POA: Diagnosis present

## 2015-08-02 DIAGNOSIS — Z880 Allergy status to penicillin: Secondary | ICD-10-CM

## 2015-08-02 DIAGNOSIS — E039 Hypothyroidism, unspecified: Secondary | ICD-10-CM | POA: Diagnosis present

## 2015-08-02 DIAGNOSIS — Z886 Allergy status to analgesic agent status: Secondary | ICD-10-CM

## 2015-08-02 DIAGNOSIS — I5033 Acute on chronic diastolic (congestive) heart failure: Secondary | ICD-10-CM | POA: Diagnosis present

## 2015-08-02 DIAGNOSIS — R609 Edema, unspecified: Secondary | ICD-10-CM

## 2015-08-02 DIAGNOSIS — Z9114 Patient's other noncompliance with medication regimen: Secondary | ICD-10-CM

## 2015-08-02 DIAGNOSIS — R0602 Shortness of breath: Secondary | ICD-10-CM

## 2015-08-02 DIAGNOSIS — J9601 Acute respiratory failure with hypoxia: Secondary | ICD-10-CM | POA: Diagnosis present

## 2015-08-02 DIAGNOSIS — Z6841 Body Mass Index (BMI) 40.0 and over, adult: Secondary | ICD-10-CM

## 2015-08-02 DIAGNOSIS — I509 Heart failure, unspecified: Secondary | ICD-10-CM

## 2015-08-02 DIAGNOSIS — G4733 Obstructive sleep apnea (adult) (pediatric): Secondary | ICD-10-CM | POA: Diagnosis present

## 2015-08-02 DIAGNOSIS — R600 Localized edema: Secondary | ICD-10-CM

## 2015-08-02 DIAGNOSIS — R0902 Hypoxemia: Secondary | ICD-10-CM | POA: Diagnosis present

## 2015-08-02 DIAGNOSIS — J209 Acute bronchitis, unspecified: Secondary | ICD-10-CM | POA: Diagnosis present

## 2015-08-02 HISTORY — DX: Cellulitis of unspecified part of limb: L03.119

## 2015-08-02 HISTORY — DX: Bronchitis, not specified as acute or chronic: J40

## 2015-08-02 LAB — CBC WITH DIFFERENTIAL/PLATELET
Basophils Absolute: 0.1 10*3/uL (ref 0–0.1)
Basophils Relative: 1 %
EOS PCT: 3 %
Eosinophils Absolute: 0.3 10*3/uL (ref 0–0.7)
HCT: 34.9 % — ABNORMAL LOW (ref 35.0–47.0)
Hemoglobin: 11.5 g/dL — ABNORMAL LOW (ref 12.0–16.0)
LYMPHS ABS: 2.1 10*3/uL (ref 1.0–3.6)
Lymphocytes Relative: 20 %
MCH: 33.5 pg (ref 26.0–34.0)
MCHC: 32.9 g/dL (ref 32.0–36.0)
MCV: 101.6 fL — AB (ref 80.0–100.0)
MONO ABS: 0.5 10*3/uL (ref 0.2–0.9)
Monocytes Relative: 5 %
NEUTROS PCT: 71 %
Neutro Abs: 7.6 10*3/uL — ABNORMAL HIGH (ref 1.4–6.5)
PLATELETS: 144 10*3/uL — AB (ref 150–440)
RBC: 3.44 MIL/uL — AB (ref 3.80–5.20)
RDW: 16.1 % — AB (ref 11.5–14.5)
WBC: 10.6 10*3/uL (ref 3.6–11.0)

## 2015-08-02 LAB — BASIC METABOLIC PANEL
Anion gap: 11 (ref 5–15)
BUN: 31 mg/dL — ABNORMAL HIGH (ref 6–20)
CO2: 27 mmol/L (ref 22–32)
Calcium: 9.8 mg/dL (ref 8.9–10.3)
Chloride: 99 mmol/L — ABNORMAL LOW (ref 101–111)
Creatinine, Ser: 1.6 mg/dL — ABNORMAL HIGH (ref 0.44–1.00)
GFR calc Af Amer: 44 mL/min — ABNORMAL LOW (ref >60)
GFR calc non Af Amer: 38 mL/min — ABNORMAL LOW (ref >60)
Glucose, Bld: 145 mg/dL — ABNORMAL HIGH (ref 65–99)
Potassium: 4.3 mmol/L (ref 3.5–5.1)
Sodium: 137 mmol/L (ref 135–145)

## 2015-08-02 LAB — URINALYSIS COMPLETE WITH MICROSCOPIC (ARMC ONLY)
Bilirubin Urine: NEGATIVE
Glucose, UA: NEGATIVE mg/dL
Hgb urine dipstick: NEGATIVE
Ketones, ur: NEGATIVE mg/dL
Leukocytes, UA: NEGATIVE
Nitrite: NEGATIVE
PROTEIN: NEGATIVE mg/dL
Specific Gravity, Urine: 1.006 (ref 1.005–1.030)
pH: 5 (ref 5.0–8.0)

## 2015-08-02 LAB — TSH: TSH: 78.727 u[IU]/mL — ABNORMAL HIGH (ref 0.350–4.500)

## 2015-08-02 LAB — TROPONIN I: Troponin I: <0.03 ng/mL (ref <0.031)

## 2015-08-02 LAB — BRAIN NATRIURETIC PEPTIDE: B Natriuretic Peptide: 22 pg/mL (ref 0.0–100.0)

## 2015-08-02 MED ORDER — IPRATROPIUM-ALBUTEROL 0.5-2.5 (3) MG/3ML IN SOLN
3.0000 mL | Freq: Once | RESPIRATORY_TRACT | Status: AC
  Administered 2015-08-02: 3 mL via RESPIRATORY_TRACT
  Filled 2015-08-02: qty 3

## 2015-08-02 MED ORDER — LEVOTHYROXINE SODIUM 75 MCG PO TABS
75.0000 ug | ORAL_TABLET | Freq: Every day | ORAL | Status: DC
  Administered 2015-08-03: 75 ug via ORAL
  Filled 2015-08-02: qty 1

## 2015-08-02 MED ORDER — HYDROCODONE-ACETAMINOPHEN 5-325 MG PO TABS
1.0000 | ORAL_TABLET | Freq: Three times a day (TID) | ORAL | Status: DC | PRN
  Administered 2015-08-02 – 2015-08-06 (×8): 1 via ORAL
  Filled 2015-08-02 (×8): qty 1

## 2015-08-02 MED ORDER — ACETAMINOPHEN 650 MG RE SUPP: 650.0000 mg | Freq: Four times a day (QID) | RECTAL | Status: DC | PRN

## 2015-08-02 MED ORDER — ENOXAPARIN SODIUM 40 MG/0.4ML ~~LOC~~ SOLN
40.0000 mg | Freq: Two times a day (BID) | SUBCUTANEOUS | Status: DC
  Administered 2015-08-02 – 2015-08-06 (×8): 40 mg via SUBCUTANEOUS
  Filled 2015-08-02 (×8): qty 0.4

## 2015-08-02 MED ORDER — ACETAMINOPHEN 325 MG PO TABS
650.0000 mg | ORAL_TABLET | Freq: Four times a day (QID) | ORAL | Status: DC | PRN
  Administered 2015-08-02 – 2015-08-03 (×2): 650 mg via ORAL
  Administered 2015-08-03: 325 mg via ORAL
  Administered 2015-08-05 (×3): 650 mg via ORAL
  Filled 2015-08-02 (×2): qty 2
  Filled 2015-08-02: qty 1
  Filled 2015-08-02 (×3): qty 2

## 2015-08-02 MED ORDER — FUROSEMIDE 10 MG/ML IJ SOLN
40.0000 mg | Freq: Once | INTRAMUSCULAR | Status: AC
  Administered 2015-08-02: 40 mg via INTRAVENOUS
  Filled 2015-08-02: qty 4

## 2015-08-02 MED ORDER — ONDANSETRON HCL 4 MG PO TABS: 4.0000 mg | ORAL_TABLET | Freq: Four times a day (QID) | ORAL | Status: DC | PRN

## 2015-08-02 MED ORDER — FUROSEMIDE 40 MG PO TABS
40.0000 mg | ORAL_TABLET | Freq: Every day | ORAL | Status: DC
  Administered 2015-08-03: 40 mg via ORAL
  Filled 2015-08-02: qty 1

## 2015-08-02 MED ORDER — ONDANSETRON HCL 4 MG/2ML IJ SOLN: 4.0000 mg | Freq: Four times a day (QID) | INTRAMUSCULAR | Status: DC | PRN

## 2015-08-02 NOTE — Progress Notes (Signed)
TSH 78 All her symptoms are s/o primary hypothyroidsmi (untreated0 Start po synthroid 75 mcg

## 2015-08-02 NOTE — ED Notes (Signed)
Increased leg edema x 1 month. States has has trouble with edema x 6 months. Edema noted generalized, face even appears slightly edematous. Speaking full sentences.

## 2015-08-02 NOTE — ED Notes (Signed)
O2 sat ranging between 85-92 on RA. Pt does not usually wear O2 at home. Sat 95% on 1L n/c. MD notified.

## 2015-08-02 NOTE — ED Notes (Signed)
Pt placed on 2L n/c for SpO2 90% at rest.

## 2015-08-02 NOTE — ED Notes (Signed)
Pt reporting SOB and cough. Leg redness, facial and leg swelling X1 month. Pt reported recent diagnosis of cellulitis with abx course finished. Pt reports taking lasix at home, but not had any for weeks with >50lb weight gain.

## 2015-08-02 NOTE — ED Notes (Signed)
Family at bedside. 

## 2015-08-02 NOTE — ED Provider Notes (Addendum)
[name] Note   ____________________________________________  Time seen: I have reviewed the triage vital signs and the triage nursing note.  HISTORY  Chief Complaint Leg Swelling   Historian Patient  HPI Connie Spencer is a 45 y.o. female is here for evaluation of swelling of her lower extremities bilaterally, up to her abdomen and she is also complaining of shortness of breath. She has some chest pressure. She states that she has chronic bronchitis. No recent change in sputum or fevers.  No primary care doctor.  She has been taking 40mg  lasix daily from a prior ER visit.  She states she's gotten worse over 1 month.    Past Medical History  Diagnosis Date  . Hypertension   . Thyroid disease   . Asthma     There are no active problems to display for this patient.   Past Surgical History  Procedure Laterality Date  . Cholecystectomy      Current Outpatient Rx  Name  Route  Sig  Dispense  Refill  . albuterol (PROVENTIL HFA;VENTOLIN HFA) 108 (90 Base) MCG/ACT inhaler   Inhalation   Inhale 2 puffs into the lungs every 6 (six) hours as needed for wheezing or shortness of breath.   1 Inhaler   0   . furosemide (LASIX) 40 MG tablet   Oral   Take 1 tablet (40 mg total) by mouth daily.   30 tablet   0   . cephALEXin (KEFLEX) 500 MG capsule   Oral   Take 1 capsule (500 mg total) by mouth 3 (three) times daily.   30 capsule   0   . HYDROcodone-acetaminophen (NORCO/VICODIN) 5-325 MG tablet   Oral   Take 1 tablet by mouth every 4 (four) hours as needed for moderate pain.   20 tablet   0   . potassium chloride (KLOR-CON 10) 10 MEQ tablet   Oral   Take 1 tablet (10 mEq total) by mouth daily.   30 tablet   0   . sulfamethoxazole-trimethoprim (BACTRIM DS,SEPTRA DS) 800-160 MG tablet   Oral   Take 1 tablet by mouth 2 (two) times daily. Patient not taking: Reported on 05/19/2015   20 tablet   0      Allergies Aspirin and Penicillins  No family history on file.  Social History Social History  Substance Use Topics  . Smoking status: Never Smoker   . Smokeless tobacco: None  . Alcohol Use: No    Review of Systems  Constitutional: Negative for fever. Eyes: Negative for visual changes. ENT: Negative for sore throat. Cardiovascular: Pos for chest pressure, no pleuritic chest pain Respiratory: Pos for shortness of breath. Gastrointestinal: Negative for abdominal pain, vomiting and diarrhea. Genitourinary: Negative for dysuria. Musculoskeletal: Negative for back pain. Skin: Negative for rash. Neurological: Negative for headache. 10 point Review of Systems otherwise negative ____________________________________________   PHYSICAL EXAM:  VITAL SIGNS: ED Triage Vitals  Enc Vitals Group     BP 08/02/15 1014 144/72 mmHg     Pulse Rate 08/02/15 1014 67     Resp 08/02/15 1014 20     Temp 08/02/15 1014 97.6 F (36.4 C)     Temp Source 08/02/15 1014 Oral     SpO2 08/02/15 1014 94 %     Weight 08/02/15 1014 333 lb (151.048 kg)     Height 08/02/15 1014 5' (1.524 m)     Head Cir --      Peak Flow --  Pain Score 08/02/15 1016 10     Pain Loc --      Pain Edu? --      Excl. in GC? --      Constitutional: Alert and oriented. Well appearing and in no distress. HEENT   Head: Normocephalic and atraumatic.      Eyes: Conjunctivae are normal. PERRL. Normal extraocular movements.      Ears:         Nose: No congestion/rhinnorhea.   Mouth/Throat: Mucous membranes are moist.   Neck: No stridor. Cardiovascular/Chest: Normal rate, regular rhythm.  No murmurs, rubs, or gallops. Respiratory: Normal respiratory effort without tachypnea nor retractions. Decreased breath sounds throughout. Very mild and expiratory wheezing. Gastrointestinal: Soft. No guarding, no rebound. Nontender. Obese, tight abdomen.  Genitourinary/rectal:Deferred Musculoskeletal: Nontender with  normal range of motion in all extremities. No joint effusions. Extremely swollen bilateral lower show any pitting edema. Some soreness of bilateral lower extremities at the tight skin. Some erythema without cellulitic-looking. Neurologic:  Normal speech and language. No gross or focal neurologic deficits are appreciated. Skin:  Skin is warm, dry and intact. No rash noted. Psychiatric: Mood and affect are normal. Speech and behavior are normal. Patient exhibits appropriate insight and judgment.  ____________________________________________   EKG I, Governor Rooks, MD, the attending physician have personally viewed and interpreted all ECGs.  59 bpm. Normal sinus rhythm. Narrow QRS. Normal axis. Nonspecific ST and T-wave ____________________________________________  LABS (pertinent positives/negatives)  Labs Reviewed  CBC WITH DIFFERENTIAL/PLATELET - Abnormal; Notable for the following:    RBC 3.44 (*)    Hemoglobin 11.5 (*)    HCT 34.9 (*)    MCV 101.6 (*)    RDW 16.1 (*)    Platelets 144 (*)    Neutro Abs 7.6 (*)    All other components within normal limits  BASIC METABOLIC PANEL - Abnormal; Notable for the following:    Chloride 99 (*)    Glucose, Bld 145 (*)    BUN 31 (*)    Creatinine, Ser 1.60 (*)    GFR calc non Af Amer 38 (*)    GFR calc Af Amer 44 (*)    All other components within normal limits  TROPONIN I  BRAIN NATRIURETIC PEPTIDE    ____________________________________________  RADIOLOGY All Xrays were viewed by me. Imaging interpreted by Radiologist.  Chest:  IMPRESSION: 1. Mild diffuse peribronchial cuffing which appears to be chronic, and likely related to underlying reactive airway disease. 2. Mild cardiomegaly. __________________________________________  PROCEDURES  Procedure(s) performed: None  Critical Care performed: None  ____________________________________________   ED COURSE / ASSESSMENT AND PLAN  Pertinent labs & imaging results that  were available during my care of the patient were reviewed by me and considered in my medical decision making (see chart for details).   Patient appears clinically in volume overload state.  She does not report a history of CHF, but she certainly has extreme peripheral edema which appears to be up-to-date abdomen show any shortness of breath with a room air O2 sat of 91%. She does have a history of "chronic bronchitis "and has a little bit of an extra auditory wheezing, but I think that her volume overload state is probably more concerning to her respiratory complaints.   Chest x-ray does not show pulmonary edema, however she does have cardiomegaly. Clinically she certainly seems to be having congestive heart failure and with her low O2 sat, worsening on 40 mg Lasix at home, and borderline creatinine, making IV diuresis  in the hospital the safest option at this point.  The minimal leg redness I think is due to edema, rather than acute cellulitis.  She did have her legs ultrasounded recently in march for similar symptoms without acute DVT.  Her legs have progressively been worsening in terms of overall swelling, but so has the swelling throughout her body.  CONSULTATIONS:  Hospitalist for admission.   Patient / Family / Caregiver informed of clinical course, medical decision-making process, and agree with plan.   ___________________________________________   FINAL CLINICAL IMPRESSION(S) / ED DIAGNOSES   Final diagnoses:  Acute congestive heart failure, unspecified congestive heart failure type (HCC)  Peripheral edema              Note: This dictation was prepared with Dragon dictation. Any transcriptional errors that result from this process are unintentional   Governor Rooksebecca Latori Beggs, MD 08/02/15 1423  Governor Rooksebecca Ardit Danh, MD 08/02/15 [phone]

## 2015-08-02 NOTE — H&P (Addendum)
[facility] [place] at [facility]   PATIENT NAME: Connie Spencer    MR#:  [id number]  DATE OF BIRTH:  05-29-1970  DATE OF ADMISSION:  08/02/2015  PRIMARY CARE PHYSICIAN: No PCP Per Patient   REQUESTING/REFERRING PHYSICIAN: dr Shaune Pollack  CHIEF COMPLAINT:   Weight gain and leg edema HISTORY OF PRESENT ILLNESS:  Connie Spencer  is a 45 y.o. female with a known history of Hypothyroidism (not on any meds)Comes to the emergency room on and off several times with increasing weight gain of 50 pounds, dry skin, leg edema. Patient did get treated for outpatient cellulitis recently. She continues to feel short of breath however workup in the emergency room did not show any symptoms of CHF. Patient has not been on her diet and medicine for a long time since she doesn't have primary care and was unable to get any refills as outpatient. She appears otherwise medically stable. Her sats dropped down to 87 on room. Patient is being admitted for hypoxia. Given her body habitus she had normal has undiagnosed SLEEP APNEA AS WELL. I tried to set up oxygen as from the emergency room however unable to do so hence admitting the patient. TSH is pending.  PAST MEDICAL HISTORY:   Past Medical History  Diagnosis Date  . Hypertension   . Thyroid disease   . Asthma     PAST SURGICAL HISTOIRY:   Past Surgical History  Procedure Laterality Date  . Cholecystectomy      SOCIAL HISTORY:   Social History  Substance Use Topics  . Smoking status: Never Smoker   . Smokeless tobacco: Not on file  . Alcohol Use: No    FAMILY HISTORY:  No family history on file.  DRUG ALLERGIES:   Allergies  Allergen Reactions  . Aspirin Nausea And Vomiting  . Penicillins Swelling    Has patient had a PCN reaction causing immediate rash, facial/tongue/throat swelling, SOB or lightheadedness with hypotension: Yes Has patient had a PCN reaction causing severe rash involving mucus membranes or skin  necrosis: No Has patient had a PCN reaction that required hospitalization No Has patient had a PCN reaction occurring within the last 10 years: No If all of the above answers are "NO", then may proceed with Cephalosporin use.    REVIEW OF SYSTEMS:  Review of Systems  Constitutional: Negative for fever, chills and weight loss.  HENT: Negative for ear discharge, ear pain and nosebleeds.   Eyes: Negative for blurred vision, pain and discharge.  Respiratory: Positive for shortness of breath. Negative for sputum production, wheezing and stridor.   Cardiovascular: Positive for leg swelling. Negative for chest pain, palpitations, orthopnea and PND.  Gastrointestinal: Negative for nausea, vomiting, abdominal pain and diarrhea.  Genitourinary: Negative for urgency and frequency.  Musculoskeletal: Negative for back pain and joint pain.  Neurological: Negative for sensory change, speech change, focal weakness and weakness.  Psychiatric/Behavioral: Negative for depression and hallucinations. The patient is not nervous/anxious.   All other systems reviewed and are negative.    MEDICATIONS AT HOME:   Prior to Admission medications   Medication Sig Start Date End Date Taking? Authorizing Provider  albuterol (PROVENTIL HFA;VENTOLIN HFA) 108 (90 Base) MCG/ACT inhaler Inhale 2 puffs into the lungs every 6 (six) hours as needed for wheezing or shortness of breath. 05/07/15  Yes Governor Rooks, MD  furosemide (LASIX) 40 MG tablet Take 1 tablet (40 mg total) by mouth daily. 05/19/15 05/18/16 Yes Minna Antis, MD  cephALEXin [facility])  500 MG capsule Take 1 capsule (500 mg total) by mouth 3 (three) times daily. 05/19/15   Minna Antis, MD  HYDROcodone-acetaminophen (NORCO/VICODIN) 5-325 MG tablet Take 1 tablet by mouth every 4 (four) hours as needed for moderate pain. 05/19/15   Minna Antis, MD  potassium chloride (KLOR-CON 10) 10 MEQ tablet Take 1 tablet (10 mEq total) by mouth daily. 05/19/15   Minna Antis, MD  sulfamethoxazole-trimethoprim (BACTRIM DS,SEPTRA DS) 800-160 MG tablet Take 1 tablet by mouth 2 (two) times daily. Patient not taking: Reported on 05/19/2015 05/07/15   Governor Rooks, MD      VITAL SIGNS:  Blood pressure 123/75, pulse 65, temperature 97.6 F (36.4 C), temperature source Oral, resp. rate 24, height 5' (1.524 m), weight 151.048 kg (333 lb), SpO2 96 %.  PHYSICAL EXAMINATION:  GENERAL:  45 y.o.-year-old patient lying in the bed with no acute distress. Severely obese EYES: Pupils equal, round, reactive to light and accommodation. No scleral icterus. Extraocular muscles intact.  HEENT: Head atraumatic, normocephalic. Oropharynx and nasopharynx clear.  NECK:  Supple, no jugular venous distention. No thyroid enlargement, no tenderness.  LUNGS: Normal breath sounds bilaterally, no wheezing, rales,rhonchi or crepitation. No use of accessory muscles of respiration.  CARDIOVASCULAR: S1, S2 normal. No murmurs, rubs, or gallops.  ABDOMEN: Soft, nontender, nondistended. Bowel sounds present. No organomegaly or mass.  EXTREMITIES 1+ pedal edema, very dry skin ,no cyanosis, or clubbing.  NEUROLOGIC: Cranial nerves II through XII are intact. Muscle strength 5/5 in all extremities. Sensation intact. Gait not checked.  PSYCHIATRIC: The patient is alert and oriented x 3.  SKIN: No obvious rash, lesion, or ulcer.   LABORATORY PANEL:   CBC  Recent Labs Lab 08/02/15 1049  WBC 10.6  HGB 11.5*  HCT 34.9*  PLT 144*   ------------------------------------------------------------------------------------------------------------------  Chemistries   Recent Labs Lab 08/02/15 1230  NA 137  K 4.3  CL 99*  CO2 27  GLUCOSE 145*  BUN 31*  CREATININE 1.60*  CALCIUM 9.8   ------------------------------------------------------------------------------------------------------------------  Cardiac Enzymes  Recent Labs Lab 08/02/15 1230  TROPONINI <0.03    ------------------------------------------------------------------------------------------------------------------  RADIOLOGY:  Dg Chest Port 1 View  08/02/2015  CLINICAL DATA:  45 year old female with shortness breath, cough and chronic bronchitis. History of asthma. EXAM: PORTABLE CHEST 1 VIEW COMPARISON:  Chest x-ray 05/19/2015. FINDINGS: Lung volumes are normal. No consolidative airspace disease. No pleural effusions. Diffuse peribronchial cuffing, similar to prior examination. No evidence of pulmonary edema. Mild cardiomegaly. Upper mediastinal contours are within normal limits. IMPRESSION: 1. Mild diffuse peribronchial cuffing which appears to be chronic, and likely related to underlying reactive airway disease. 2. Mild cardiomegaly. Electronically Signed   By: Trudie Reed Spencer.   On: 08/02/2015 13:51    EKG:   Sinus rhythm, low voltage criteria IMPRESSION AND PLAN:   Connie Spencer  is a 45 y.o. female with a known history of Hypothyroidism (not on any meds)Comes to the emergency room on and off several times with increasing weight gain of 50 pounds, dry skin, leg edema. Patient did get treated for outpatient cellulitis recently. She continues to feel short of breath however workup in the emergency room did not show any symptoms of CHF.  1. Peripheral edema, dry skin, weight gain of 50 pounds and history of untreated hypothyroidism appears likely due to hypothyroidism -TSH pending -Patient reports she was taking 100 g of Synthroid however has not been able to get refills through [facility]. since she does not have a primary care physician -  Echo of the heart -Patient will need outpatient follow-up -Urinanalysis neg for proteinuria ruled out NS  2. Hypoxemia suspected due to morbid obesity and an undiagnosed OSA -Her sats dropped in the emergency room 87-88% on room air -Management for oxygen set up at home  3. Morbid obesity Diet and exercise advised  4. DVT prophylaxis subcutaneous  Lovenox    All the records are reviewed and case discussed with ED provider. Management plans discussed with the patient, family and they are in agreement.  CODE STATUS: Full code TOTAL TIME TAKING CARE OF THIS PATIENT: 50 minutes.    Connie Spencer  Between 7am to 6pm - Pager - [phone]  After 6pm go to [email or web address] - password [email or web address]  [facility]  Office  [phone]  CC: Primary care physician; No PCP Per Patient

## 2015-08-03 ENCOUNTER — Observation Stay
Admit: 2015-08-03 | Discharge: 2015-08-03 | Disposition: A | Discharge status: Home / Self Care | Payer: Self-pay | Attending: Internal Medicine | Admitting: Internal Medicine

## 2015-08-03 ENCOUNTER — Observation Stay: Admit: 2015-08-03 | Payer: Self-pay

## 2015-08-03 LAB — ECHOCARDIOGRAM COMPLETE
Height: 60 in
WEIGHTICAEL: 5344 [oz_av]

## 2015-08-03 MED ORDER — LEVOTHYROXINE SODIUM 100 MCG IV SOLR
50.0000 ug | Freq: Every day | INTRAVENOUS | Status: DC
  Administered 2015-08-04 – 2015-08-05 (×2): 50 ug via INTRAVENOUS
  Filled 2015-08-03 (×3): qty 5

## 2015-08-03 MED ORDER — MORPHINE SULFATE (PF) 2 MG/ML IV SOLN
2.0000 mg | Freq: Once | INTRAVENOUS | Status: AC
  Administered 2015-08-03: 2 mg via INTRAVENOUS
  Filled 2015-08-03: qty 1

## 2015-08-03 NOTE — Progress Notes (Signed)
Subjective: Admitted with hypoxia and hypothyriodism Feels tired and short of breath on execertion.  Objective: Vital signs in last 24 hours: Temp:  [97.4 F (36.3 C)-98.3 F (36.8 C)] 98.3 F (36.8 C) (05/28 1502) Pulse Rate:  [57-71] 67 (05/28 1502) Resp:  [15-24] 16 (05/28 1502) BP: (90-152)/(52-103) 144/80 mmHg (05/28 1502) SpO2:  [79 %-100 %] 97 % (05/28 1502) Weight:  [151.501 kg (334 lb)] 151.501 kg (334 lb) (05/27 2047) Weight change:  Last BM Date: 08/02/15  Intake/Output from previous day:   Intake/Output this shift: Total I/O In: 240 [P.O.:240] Out: -   General appearance: alert, cooperative and no distress Head: Normocephalic, without obvious abnormality, atraumatic Neck: no adenopathy, no carotid bruit, no JVD, supple, symmetrical, trachea midline and thyroid not enlarged, symmetric, no tenderness/mass/nodules Resp: clear to auscultation bilaterally and normal percussion bilaterally Cardio: regular rate and rhythm Extremities: edema Non pitting. Skin: Dry Neurologic: Alert and oriented X 3, normal strength and tone. Normal symmetric reflexes. Normal coordination and gait  Lab Results:  Recent Labs  08/02/15 1049  WBC 10.6  HGB 11.5*  HCT 34.9*  PLT 144*   BMET  Recent Labs  08/02/15 1230  NA 137  K 4.3  CL 99*  CO2 27  GLUCOSE 145*  BUN 31*  CREATININE 1.60*  CALCIUM 9.8    Studies/Results: Dg Chest Port 1 View  08/02/2015  CLINICAL DATA:  45 year old female with shortness breath, cough and chronic bronchitis. History of asthma. EXAM: PORTABLE CHEST 1 VIEW COMPARISON:  Chest x-ray 05/19/2015. FINDINGS: Lung volumes are normal. No consolidative airspace disease. No pleural effusions. Diffuse peribronchial cuffing, similar to prior examination. No evidence of pulmonary edema. Mild cardiomegaly. Upper mediastinal contours are within normal limits. IMPRESSION: 1. Mild diffuse peribronchial cuffing which appears to be chronic, and likely related to  underlying reactive airway disease. 2. Mild cardiomegaly. Electronically Signed   By: Trudie Reedaniel  Entrikin M.D.   On: 08/02/2015 13:51    Medications:  Scheduled: . enoxaparin (LOVENOX) injection  40 mg Subcutaneous BID  . furosemide  40 mg Oral Daily  . [START ON 08/04/2015] levothyroxine  50 mcg Intravenous Daily   Continuous:   Assessment/Plan: 1. Hypoxia: Suspect hypoxia of obesity. No hx lung dz and CXR does not show pulm edema. 2. 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 Has non pitting edema and other signs of hypothyroidism. Will change synthriod to IV to assure obsorption. Consider adding steriods. No signs of heart failure. Echo shows EF of 65-70% 3. Obstructive sleep apnea: On O2. Consider CPAP. Will need outpt sleep study.   Time spent: 45min    Gracelyn NurseJohnston,  John D 08/03/2015, 3:17 PM

## 2015-08-03 NOTE — Progress Notes (Signed)
Pt having severe cramping bilateral calves and feet, unimproved after norco, tylenol, rest, elevation and ice packs.  Dr Tobi BastosPyreddy notified of pt's sx and interventions, morphine 2mg  IVx1 ordered and administered.

## 2015-08-03 NOTE — Plan of Care (Signed)
Problem: Physical Regulation: Goal: Ability to maintain clinical measurements within normal limits will improve Outcome: Not Progressing Patient not keeping oxygen on but was educated on importance.

## 2015-08-03 NOTE — Progress Notes (Signed)
*  PRELIMINARY RESULTS* Echocardiogram 2D Echocardiogram has been performed.  Georgann HousekeeperJerry R Hege 08/03/2015, 7:49 AM

## 2015-08-03 NOTE — Progress Notes (Signed)
Nurse aid reported that patient is removing her oxygen.  Oxygen levels dropping as low as 79%.  Educated on keeping oxygen on.

## 2015-08-04 ENCOUNTER — Observation Stay: Payer: Self-pay

## 2015-08-04 DIAGNOSIS — R0902 Hypoxemia: Secondary | ICD-10-CM

## 2015-08-04 LAB — FIBRIN DERIVATIVES D-DIMER (ARMC ONLY): Fibrin derivatives D-dimer (ARMC): 284 (ref 0–499)

## 2015-08-04 LAB — BRAIN NATRIURETIC PEPTIDE: B NATRIURETIC PEPTIDE 5: 22 pg/mL (ref 0.0–100.0)

## 2015-08-04 MED ORDER — IPRATROPIUM-ALBUTEROL 0.5-2.5 (3) MG/3ML IN SOLN
3.0000 mL | Freq: Four times a day (QID) | RESPIRATORY_TRACT | Status: DC
  Administered 2015-08-04 – 2015-08-05 (×4): 3 mL via RESPIRATORY_TRACT
  Filled 2015-08-04 (×6): qty 3

## 2015-08-04 MED ORDER — IPRATROPIUM-ALBUTEROL 0.5-2.5 (3) MG/3ML IN SOLN
RESPIRATORY_TRACT | Status: AC
  Administered 2015-08-04: 3 mL
  Filled 2015-08-04: qty 3

## 2015-08-04 MED ORDER — FUROSEMIDE 40 MG PO TABS
40.0000 mg | ORAL_TABLET | Freq: Every day | ORAL | Status: DC
  Administered 2015-08-04 – 2015-08-05 (×2): 40 mg via ORAL
  Filled 2015-08-04 (×2): qty 1

## 2015-08-04 MED ORDER — METHYLPREDNISOLONE SODIUM SUCC 40 MG IJ SOLR
40.0000 mg | Freq: Two times a day (BID) | INTRAMUSCULAR | Status: DC
  Administered 2015-08-04 – 2015-08-05 (×3): 40 mg via INTRAVENOUS
  Filled 2015-08-04 (×3): qty 1

## 2015-08-04 MED ORDER — IPRATROPIUM-ALBUTEROL 0.5-2.5 (3) MG/3ML IN SOLN: 3.0000 mL | Freq: Four times a day (QID) | RESPIRATORY_TRACT | Status: DC | PRN

## 2015-08-04 NOTE — Care Management Note (Addendum)
Case Management Note  Patient Details  Name: Connie AskewSherry Spencer MRN: [id number] Date of Birth: 02/21/1971  Subjective/Objective:     44yo Connie Connie AskewSherry Spencer was admitted with severe lower extremity edema and shortness of breath on 08/02/15. Resides with her female friend who provides transportation per Connie Spencer does not drive. No PCP, Uninsured and has not been taking medication for Hypothyroidism. Recent 50 lb weight gain which appears to be lower extremity fluid. Reports having no home assistive equipment, no home oxygen, no home health services. Vague about why she has not applied for Medicaid, [place]. Connie Spencer quickly became out of breath during this assessment. Currently on 2L N/C (titrating) and IV Synthroid per possible Myxedema. Will need assistance with medications and possible charity oxygen and home health services after discharge. May benefit from a home health SW to assist her to follow up with applying for Medicaid, assess for mobility barriers or psychosocial barriers to applying for Medicaid. Appears to be very dependent on Boyfriend. Case management will follow for discharge planning.                 Action/Plan:   Expected Discharge Date:  08/06/15               Expected Discharge Plan:     In-House Referral:     Discharge planning Services     Post Acute Care Choice:    Choice offered to:     DME Arranged:    DME Agency:     HH Arranged:    HH Agency:     Status of Service:     Medicare Important Message Given:    Date Medicare IM Given:    Medicare IM give by:    Date Additional Medicare IM Given:    Additional Medicare Important Message give by:     If discussed at Long Length of Stay Meetings, dates discussed:    Additional Comments:  Ashante Snelling A, RN 08/04/2015, 4:55 PM

## 2015-08-04 NOTE — Consult Note (Signed)
[facility] Consultation      Assessment and Plan:  Acute hypoxic respiratory failure. -I suspect this patient has volume overload, pulmonary edema, given her recent weight gain, visible volume overload, and hypothyroidism. -We'll check a BNP, d-dimer, lower extremity Doppler. Will hold off on CT angiogram as the patient has acute kidney injury. -Wean down oxygen as tolerated. -Continue diuresis.  Symptoms and signs of obstructive sleep apnea. -Outpatient sleep study.  Acute hypothyroidism. -Likely contributing to her acute respiratory failure.  Morbid obesity. -Likely contributing to dyspnea, weight loss would be beneficial.    Date: 08/04/2015  MRN# [id number] Doyle AskewSherry Guest 09/07/1970  Referring Physician:   Doyle AskewSherry Gabrielson is a 45 y.o. old female seen in consultation for chief complaint of:    Chief Complaint  Patient presents with  . Leg Swelling    HPI:   The patient is a 45 year old female with a history of hypothyroidism. She presented to the hospital 3 days ago with increasing weight gain and lower extremity edema. She had not been taking any of her medications, and hence was off of her hypothyroidism medication. She presented with weight gain of approximately 50 pounds, dry skin, lower extremity edema. The pulmonary service is consulted as the patient has required oxygen, in addition, early this morning she complained of acute shortness of breath, and central chest pain. She said she has never experiences symptoms in the past. She denies any dizziness, she has no history of previous blood clots. Her breathing has improved, she continues to have some mild pain on inspiration in the center of her chest. She has no other particular complaints at this time.   PMHX:   Past Medical History  Diagnosis Date  . Hypertension   . Thyroid disease   . Asthma   . Cellulitis of multiple sites of lower extremity     BLE  . Bronchitis    Surgical Hx:  Past  Surgical History  Procedure Laterality Date  . Cholecystectomy     Family Hx:  History reviewed. No pertinent family history. Social Hx:   Social History  Substance Use Topics  . Smoking status: Never Smoker   . Smokeless tobacco: None  . Alcohol Use: No   Medication:   No current outpatient prescriptions on file.    Allergies:  Aspirin and Penicillins  Review of Systems: Gen:  Denies  fever, sweats, chills HEENT: Denies blurred vision, double vision.  Cvc:  No dizziness, chest pain. Resp:   Denies cough or sputum production,  Gi: Denies swallowing difficulty, stomach pain. Gu:  Denies bladder incontinence, burning urine Ext:   No Joint pain, stiffness. Skin: No skin rash,  hives  Endoc:  No polyuria, polydipsia. Psych: No depression, insomnia. Other:  All other systems were reviewed with the patient and were negative other that what is mentioned in the HPI.   Physical Examination:   VS: BP 123/65 mmHg  Pulse 63  Temp(Src) 98 F (36.7 C) (Oral)  Resp 17  Ht 5' (1.524 m)  Wt 334 lb (151.501 kg)  BMI 65.23 kg/m2  SpO2 95%  General Appearance: No distress  Neuro:without focal findings,  speech normal,  HEENT: PERRLA, EOM intact.   Pulmonary: normal breath sounds, No wheezing.  CardiovascularNormal S1,S2.  No m/r/g.   Abdomen: Benign, Soft, non-tender. Renal:  No costovertebral tenderness  GU:  No performed at this time. Endoc: No evident thyromegaly, no signs of acromegaly. Skin:   warm, no rashes, no ecchymosis  Extremities: normal,  no cyanosis, clubbing.  Other findings:    LABORATORY PANEL:   CBC  Recent Labs Lab 08/02/15 1049  WBC 10.6  HGB 11.5*  HCT 34.9*  PLT 144*   ------------------------------------------------------------------------------------------------------------------  Chemistries   Recent Labs Lab 08/02/15 1230  NA 137  K 4.3  CL 99*  CO2 27  GLUCOSE 145*  BUN 31*  CREATININE 1.60*  CALCIUM 9.8    ------------------------------------------------------------------------------------------------------------------  Cardiac Enzymes  Recent Labs Lab 08/02/15 1230  TROPONINI <0.03   ------------------------------------------------------------  RADIOLOGY:  Dg Chest 1 View  08/04/2015  CLINICAL DATA:  Pt reports being hospitalized x 1 week with shortness of breath and lower extremity swelling, shortness of breath worsened this morning around 6am; reports h/o chronic bronchitis. Shielded. EXAM: CHEST  1 VIEW COMPARISON:  08/02/2015 FINDINGS: Mild cardiomegaly. Coarse perihilar and bibasilar interstitial markings. No confluent airspace disease. No overt edema. No effusion. Visualized bones unremarkable. IMPRESSION: 1. Stable cardiomegaly and coarse interstitial opacities. Electronically Signed   By: Corlis Leak M.D.   On: 08/04/2015 10:39       Thank  you for the consultation and for allowing [facility] to assist in the care of your patient. Our recommendations are noted above.  Please contact us if we can be of further service.   Wells Guiles, MD.  Board Certified in Internal Medicine, Pulmonary Medicine, Critical Care Medicine, and Sleep Medicine.  [facility] Office Number: [phone]  Santiago Glad, M.D.  Stephanie Acre, M.D.  Billy Fischer, M.D  08/04/2015

## 2015-08-04 NOTE — Progress Notes (Signed)
Subjective: Admitted with hypoxia and hypothyriodism C/O shortness of breath this morning.  Objective: Vital signs in last 24 hours: Temp:  [97.4 F (36.3 C)-98.3 F (36.8 C)] 97.8 F (36.6 C) (05/29 0726) Pulse Rate:  [55-67] 59 (05/29 0726) Resp:  [16-19] 18 (05/29 0726) BP: (112-144)/(61-80) 135/77 mmHg (05/29 0726) SpO2:  [79 %-100 %] 98 % (05/29 0813) Weight change:  Last BM Date: 08/02/15  Intake/Output from previous day: 05/28 0701 - 05/29 0700 In: 480 [P.O.:480] Out: 600 [Urine:600] Intake/Output this shift: Total I/O In: 420 [P.O.:420] Out: -   General appearance: alert, cooperative and no distress Head: Normocephalic, without obvious abnormality, atraumatic Neck: no adenopathy, no carotid bruit, no JVD, supple, symmetrical, trachea midline and thyroid not enlarged, symmetric, no tenderness/mass/nodules Resp: wheezes bibasilar Cardio: regular rate and rhythm Extremities: edema Non pitting. Skin: Skin color, texture, turgor normal. No rashes or lesions Neurologic: Grossly normal  Lab Results:  Recent Labs  08/02/15 1049  WBC 10.6  HGB 11.5*  HCT 34.9*  PLT 144*   BMET  Recent Labs  08/02/15 1230  NA 137  K 4.3  CL 99*  CO2 27  GLUCOSE 145*  BUN 31*  CREATININE 1.60*  CALCIUM 9.8    Studies/Results: Dg Chest Port 1 View  08/02/2015  CLINICAL DATA:  45 year old female with shortness breath, cough and chronic bronchitis. History of asthma. EXAM: PORTABLE CHEST 1 VIEW COMPARISON:  Chest x-ray 05/19/2015. FINDINGS: Lung volumes are normal. No consolidative airspace disease. No pleural effusions. Diffuse peribronchial cuffing, similar to prior examination. No evidence of pulmonary edema. Mild cardiomegaly. Upper mediastinal contours are within normal limits. IMPRESSION: 1. Mild diffuse peribronchial cuffing which appears to be chronic, and likely related to underlying reactive airway disease. 2. Mild cardiomegaly. Electronically Signed   By: Trudie Reedaniel   Entrikin M.D.   On: 08/02/2015 13:51    Medications:  Scheduled: . enoxaparin (LOVENOX) injection  40 mg Subcutaneous BID  . ipratropium-albuterol  3 mL Nebulization Q6H  . levothyroxine  50 mcg Intravenous QAC breakfast  . methylPREDNISolone (SOLU-MEDROL) injection  40 mg Intravenous Q12H   Continuous:   Assessment/Plan: 1. Hypoxia: Suspect hypoxia of obesity. No hx lung dz and CXR does not show pulm edema. Short of breath this morning with wheezing. Mild improvement with neb. May have underlying lung dz. Will consult pulmonology. 2. 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 Has non pitting edema and other signs of hypothyroidism. Will change synthriod to IV to assure obsorption.Have added steriods. No signs of heart failure. Echo shows EF of 65-70% 3. Obstructive sleep apnea: On O2. . Will need outpt sleep study.   Time spent: 45min    Gracelyn NurseJohnston,  Maxine Huynh D 08/04/2015, 9:17 AM

## 2015-08-05 DIAGNOSIS — J449 Chronic obstructive pulmonary disease, unspecified: Secondary | ICD-10-CM | POA: Diagnosis present

## 2015-08-05 LAB — CREATININE, SERUM
Creatinine, Ser: 1.85 mg/dL — ABNORMAL HIGH (ref 0.44–1.00)
GFR calc Af Amer: 37 mL/min — ABNORMAL LOW (ref >60)
GFR calc non Af Amer: 32 mL/min — ABNORMAL LOW (ref >60)

## 2015-08-05 MED ORDER — IPRATROPIUM-ALBUTEROL 0.5-2.5 (3) MG/3ML IN SOLN
3.0000 mL | Freq: Three times a day (TID) | RESPIRATORY_TRACT | Status: DC
  Administered 2015-08-05 – 2015-08-06 (×3): 3 mL via RESPIRATORY_TRACT
  Filled 2015-08-05: qty 3

## 2015-08-05 MED ORDER — FUROSEMIDE 40 MG PO TABS
40.0000 mg | ORAL_TABLET | Freq: Two times a day (BID) | ORAL | Status: DC
  Administered 2015-08-05 – 2015-08-06 (×3): 40 mg via ORAL
  Filled 2015-08-05: qty 12
  Filled 2015-08-05 (×2): qty 1

## 2015-08-05 MED ORDER — METHYLPREDNISOLONE SODIUM SUCC 40 MG IJ SOLR
40.0000 mg | Freq: Every day | INTRAMUSCULAR | Status: DC
  Administered 2015-08-06: 40 mg via INTRAVENOUS
  Filled 2015-08-05: qty 1

## 2015-08-05 MED ORDER — LEVOTHYROXINE SODIUM 100 MCG PO TABS
200.0000 ug | ORAL_TABLET | Freq: Every day | ORAL | Status: DC
  Administered 2015-08-06: 200 ug via ORAL
  Filled 2015-08-05: qty 2

## 2015-08-05 NOTE — Progress Notes (Signed)
Subjective: Admitted with hypoxia and hypothyriodism Continues to have shortness of breath. Lower extremity edema improving. On 2 L oxygen. afebrile Objective: Vital signs in last 24 hours: Temp:  [98.3 F (36.8 C)-98.5 F (36.9 C)] 98.4 F (36.9 C) (05/30 1422) Pulse Rate:  [62-76] 73 (05/30 1422) Resp:  [18-20] 18 (05/30 1422) BP: (98-165)/(71-89) 146/71 mmHg (05/30 1422) SpO2:  [93 %-98 %] 95 % (05/30 1422) Weight change:  Last BM Date: 08/02/15  Intake/Output from previous day: 05/29 0701 - 05/30 0700 In: 1840 [P.O.:1840] Out: -  Intake/Output this shift: Total I/O In: 240 [P.O.:240] Out: -   General appearance: alert, cooperative and no distress Head: Normocephalic, without obvious abnormality, atraumatic Neck: no adenopathy, no carotid bruit, no JVD, supple, symmetrical, trachea midline and thyroid not enlarged, symmetric, no tenderness/mass/nodules Resp: wheezes bibasilar Cardio: regular rate and rhythm Extremities: edema Non pitting. Skin: Skin color, texture, turgor normal. No rashes or lesions Neurologic: Grossly normal Morbidly obese  Lab Results: No results for input(s): WBC, HGB, HCT, PLT in the last 72 hours. BMET  Recent Labs  08/05/15 0406  CREATININE 1.85*    Studies/Results: Dg Chest 1 View  08/04/2015  CLINICAL DATA:  Pt reports being hospitalized x 1 week with shortness of breath and lower extremity swelling, shortness of breath worsened this morning around 6am; reports h/o chronic bronchitis. Shielded. EXAM: CHEST  1 VIEW COMPARISON:  08/02/2015 FINDINGS: Mild cardiomegaly. Coarse perihilar and bibasilar interstitial markings. No confluent airspace disease. No overt edema. No effusion. Visualized bones unremarkable. IMPRESSION: 1. Stable cardiomegaly and coarse interstitial opacities. Electronically Signed   By: Corlis Leak  Hassell M.D.   On: 08/04/2015 10:39   [place] Venous Img Lower Bilateral  08/04/2015  CLINICAL DATA:  Edema x1 month EXAM: BILATERAL  LOWER EXTREMITY VENOUS DOPPLER ULTRASOUND TECHNIQUE: Gray-scale sonography with compression, as well as color and duplex ultrasound, were performed to evaluate the deep venous system from the level of the common femoral vein through the popliteal and proximal calf veins. Technologist describes technical limitations of exam secondary to body habitus. COMPARISON:  05/19/2015 FINDINGS: Normal compressibility of the common femoral, superficial femoral, and popliteal veins, as well as the proximal calf veins. No filling defects to suggest DVT on grayscale or color Doppler imaging. Doppler waveforms show normal direction of venous flow, normal respiratory phasicity and response to augmentation. IMPRESSION: No evidence of  lower extremity deep vein thrombosis, bilaterally. Electronically Signed   By: Corlis Leak  Hassell M.D.   On: 08/04/2015 15:23    Medications:  Scheduled: . enoxaparin (LOVENOX) injection  40 mg Subcutaneous BID  . furosemide  40 mg Oral Daily  . ipratropium-albuterol  3 mL Nebulization Q6H  . [START ON 08/06/2015] levothyroxine  200 mcg Oral QAC breakfast  . methylPREDNISolone (SOLU-MEDROL) injection  40 mg Intravenous Q12H   Continuous:   Assessment/Plan:  * Acute hypoxic respiratory failure Multifactorial due to morbid obesity, acute on chronic diastolic CHF, acute bronchitis  * Acute bronchitis On IV steroids and breathing treatments.  * Acute on chronic diastolic CHF Ejection fraction 65%. On IV Lasix. Monitor input and output.  * Severe hypothyroidism Due to noncompliance with medication Initially started on IV levothyroxine. We'll transition to oral 200 MCG which she was taking in the past. On IV steroids.  * Obstructive sleep apnea: On O2. . Will need outpt sleep study.   Time spent: 45min  LOS: 0 days   Raequon Catanzaro, Molinda BailiffSrikar R 08/05/2015, 3:29 PM

## 2015-08-05 NOTE — Care Management (Signed)
Met with patient to discuss discharge planning. She lives with her boyfriend Fernande Boyden in [place]. She depends on him for transportation. She has tried to establish with [facility] in the past but did not have the application so they could not see her. She states she is independent with mobility. Her contact number is [phone]. She is SELF-PAY. Referral to [facility] and Medication mgt; applications delivered to patient. Patient will need hand delivered Rx to take to pharmacy. She does not have a nebulizer at home. O2 is also new.

## 2015-08-05 NOTE — Progress Notes (Signed)
[facility]     Assessment and Plan:  Acute hypoxic respiratory failure. -I suspect this patient has volume overload, pulmonary edema, given her recent weight gain, visible volume overload, and hypothyroidism. -Normal BNP, d-dimer, lower extremity Doppler.  -Will hold off on CT angiogram as the patient has acute kidney injury, also, the patient's chest wall pain appears to be reproducible. -Wean down oxygen as tolerated. -Continue diuresis.  Symptoms and signs of obstructive sleep apnea. -Outpatient sleep study.  Chest wall pain -Reproducible chest wall pain over the sternum, doubt PE.  Acute hypothyroidism. -Likely contributing to her acute respiratory failure.  Morbid obesity. -Likely contributing to dyspnea, weight loss would be beneficial.   Date: 08/05/2015  MRN# [id number] Connie Spencer 08/10/1970   Connie Spencer is a 45 y.o. old female seen in follow up for chief complaint of  Chief Complaint  Patient presents with  . Leg Swelling     HPI:   Patient continues to complain of chest wall pain, and episodic dyspnea, particularly with ambulation.   Allergies:  Aspirin and Penicillins  Review of Systems: Gen:  Denies  fever, sweats. HEENT: Denies blurred vision. Cvc:  No dizziness, chest pain or heaviness Resp:   Denies cough or sputum porduction. Gi: Denies swallowing difficulty, stomach pain. constipation, bowel incontinence Gu:  Denies bladder incontinence, burning urine Ext:   No Joint pain, stiffness. Skin: No skin rash, easy bruising. Endoc:  No polyuria, polydipsia. Psych: No depression, insomnia. Other:  All other systems were reviewed and found to be negative other than what is mentioned in the HPI.   Physical Examination:   VS: BP 159/75 mmHg  Pulse 66  Temp(Src) 98.3 F (36.8 C) (Oral)  Resp 18  Ht 5' (1.524 m)  Wt 334 lb (151.501 kg)  BMI 65.23 kg/m2  SpO2 95%  General Appearance: No distress  Neuro:without focal  findings,  speech normal,  HEENT: PERRLA, EOM intact. Pulmonary: normal breath sounds, No wheezing.   CardiovascularNormal S1,S2.  No m/r/g.   Abdomen: Benign, Soft, non-tender. Renal:  No costovertebral tenderness  GU:  Not performed at this time. Endoc: No evident thyromegaly, no signs of acromegaly. Skin:   warm, no rash. Extremities: normal, no cyanosis, clubbing.   LABORATORY PANEL:   CBC  Recent Labs Lab 08/02/15 1049  WBC 10.6  HGB 11.5*  HCT 34.9*  PLT 144*   ------------------------------------------------------------------------------------------------------------------  Chemistries   Recent Labs Lab 08/02/15 1230 08/05/15 0406  NA 137  --   K 4.3  --   CL 99*  --   CO2 27  --   GLUCOSE 145*  --   BUN 31*  --   CREATININE 1.60* 1.85*  CALCIUM 9.8  --    ------------------------------------------------------------------------------------------------------------------  Cardiac Enzymes  Recent Labs Lab 08/02/15 1230  TROPONINI <0.03   ------------------------------------------------------------  RADIOLOGY:   No results found for this or any previous visit. Results for orders placed during the hospital encounter of 05/19/15  DG Chest 2 View   Narrative CLINICAL DATA:  Left-sided chest pain for 2 weeks.  EXAM: CHEST  2 VIEW  COMPARISON:  Chest x-ray a 05/07/2015 and chest CT 05/07/2015.  FINDINGS: Exam is somewhat limited by body habitus and underpenetration. The heart is mildly enlarged but stable. The mediastinal and hilar contours are within normal limits and unchanged given the AP projection of the film. The lungs are grossly clear. No pleural effusion. The bony thorax is intact.  IMPRESSION: Mild stable cardiac enlargement. No  definite acute pulmonary findings.   Electronically Signed   By: Rudie MeyerP.  Gallerani M.D.   On: 05/19/2015 12:40     ------------------------------------------------------------------------------------------------------------------  Thank  you for allowing [facility] to assist in the care of your patient. Our recommendations are noted above.  Please contact us if we can be of further service.   Wells Guileseep Michall Noffke, MD.  [facility] Office Number: [phone]  Santiago Gladavid Kasa, M.D.  Stephanie AcreVishal Mungal, M.D.  Billy Fischeravid Simonds, M.D  08/05/2015

## 2015-08-06 LAB — BASIC METABOLIC PANEL
Anion gap: 10 (ref 5–15)
BUN: 39 mg/dL — AB (ref 6–20)
CALCIUM: 9.6 mg/dL (ref 8.9–10.3)
CO2: 33 mmol/L — ABNORMAL HIGH (ref 22–32)
Chloride: 94 mmol/L — ABNORMAL LOW (ref 101–111)
Creatinine, Ser: 1.81 mg/dL — ABNORMAL HIGH (ref 0.44–1.00)
GFR calc Af Amer: 38 mL/min — ABNORMAL LOW (ref >60)
GFR, EST NON AFRICAN AMERICAN: 33 mL/min — AB (ref >60)
GLUCOSE: 198 mg/dL — AB (ref 65–99)
Potassium: 3.5 mmol/L (ref 3.5–5.1)
Sodium: 137 mmol/L (ref 135–145)

## 2015-08-06 LAB — THYROID PANEL WITH TSH
T3 UPTAKE RATIO: 22 % — AB (ref 24–39)
TSH: 52.11 u[IU]/mL — ABNORMAL HIGH (ref 0.450–4.500)

## 2015-08-06 MED ORDER — FUROSEMIDE 40 MG PO TABS: 40.0000 mg | ORAL_TABLET | Freq: Every day | ORAL | Status: AC

## 2015-08-06 MED ORDER — LEVOTHYROXINE SODIUM 150 MCG PO TABS
150.0000 ug | ORAL_TABLET | Freq: Every day | ORAL | Status: AC
Start: 2015-08-06 — End: ?

## 2015-08-06 MED ORDER — ALBUTEROL SULFATE 0.63 MG/3ML IN NEBU: 1.0000 | INHALATION_SOLUTION | Freq: Four times a day (QID) | RESPIRATORY_TRACT | Status: AC | PRN

## 2015-08-06 MED ORDER — NAPROXEN 500 MG PO TABS: 500.0000 mg | ORAL_TABLET | Freq: Two times a day (BID) | ORAL | Status: AC | PRN

## 2015-08-06 MED ORDER — POTASSIUM CHLORIDE ER 10 MEQ PO TBCR: 10.0000 meq | EXTENDED_RELEASE_TABLET | Freq: Every day | ORAL | Status: AC

## 2015-08-06 MED ORDER — ALBUTEROL SULFATE HFA 108 (90 BASE) MCG/ACT IN AERS
2.0000 | INHALATION_SPRAY | RESPIRATORY_TRACT | Status: AC | PRN
Start: 2015-08-06 — End: ?

## 2015-08-06 MED ORDER — PREDNISONE 50 MG PO TABS: 50.0000 mg | ORAL_TABLET | Freq: Every day | ORAL | Status: DC

## 2015-08-06 NOTE — Progress Notes (Signed)
Discharge note: Patient discharge summary and medications reviewed with patient with verbal understanding. Hard prescriptions sheets and patient belongings given upon discharge. Escorted OOF via wc. VSS.

## 2015-08-06 NOTE — Progress Notes (Signed)
Patient POX 88% on ra at rest. Ambulated to bathroom, POX dropped to 84% with quick rebound to 91%, ambulated once more POX dropped to 85% on ra. Ambulation with 1L of O2 POX reading at 91%. Will continue to monitor

## 2015-08-06 NOTE — Discharge Instructions (Signed)

## 2015-08-06 NOTE — Discharge Summary (Signed)
[facility]   PATIENT NAME: Connie Spencer    MR#:  [id number]  DATE OF BIRTH:  March 23, 1970  DATE OF ADMISSION:  08/02/2015 ADMITTING PHYSICIAN: Enedina Finner, MD  DATE OF DISCHARGE: No discharge date for patient encounter.  PRIMARY CARE PHYSICIAN: No PCP Per Patient   ADMISSION DIAGNOSIS:  Peripheral edema [R60.9] Acute congestive heart failure, unspecified congestive heart failure type (HCC) [I50.9]  DISCHARGE DIAGNOSIS:  Active Problems:   Hypoxemia   COPD (chronic obstructive pulmonary disease) (HCC)   SECONDARY DIAGNOSIS:   Past Medical History  Diagnosis Date  . Hypertension   . Thyroid disease   . Asthma   . Cellulitis of multiple sites of lower extremity     BLE  . Bronchitis      ADMITTING HISTORY  Connie Spencer is a 45 y.o. female with a known history of Hypothyroidism (not on any meds)Comes to the emergency room on and off several times with increasing weight gain of 50 pounds, dry skin, leg edema. Patient did get treated for outpatient cellulitis recently. She continues to feel short of breath however workup in the emergency room did not show any symptoms of CHF. Patient has not been on her diet and medicine for a long time since she doesn't have primary care and was unable to get any refills as outpatient. She appears otherwise medically stable. Her sats dropped down to 87 on room. Patient is being admitted for hypoxia. Given her body habitus she had normal has undiagnosed SLEEP APNEA AS WELL. I tried to set up oxygen as from the emergency room however unable to do so hence admitting the patient. TSH is pending.   HOSPITAL COURSE:   * Acute hypoxic respiratory failure Multifactorial due to morbid obesity, acute on chronic diastolic CHF, acute bronchitis  * Acute bronchitis On IV steroids and breathing treatments. Social history of oral prednisone at discharge. Albuterol nebulizer prescribed. Nebulizers machine  provider by case Production designer, theatre/television/film.  * Acute on chronic diastolic CHF Ejection fraction 65%. On IV Lasix. Change to oral Lasix  * CKD stage III stable  * Severe hypothyroidism Due to noncompliance with medication Initially started on IV levothyroxine. We'll transition to oral 150 MCG which she was taking in the past. She will need a repeat TSH checked in 6 weeks and titration of dose.  * Obstructive sleep apnea: On O2. . Will need outpt sleep study. Home oxygen set up.  Stable for discharge home.  Patient counseled at length to be compliant with medications and follow-up. High risk for readmission.  CONSULTS OBTAINED:  Treatment Team:  Shane Crutch, MD  DRUG ALLERGIES:   Allergies  Allergen Reactions  . Aspirin Nausea And Vomiting  . Penicillins Swelling    Has patient had a PCN reaction causing immediate rash, facial/tongue/throat swelling, SOB or lightheadedness with hypotension: Yes Has patient had a PCN reaction causing severe rash involving mucus membranes or skin necrosis: No Has patient had a PCN reaction that required hospitalization No Has patient had a PCN reaction occurring within the last 10 years: No If all of the above answers are "NO", then may proceed with Cephalosporin use.    DISCHARGE MEDICATIONS:   Current Discharge Medication List    START taking these medications   Details  albuterol (ACCUNEB) 0.63 MG/3ML nebulizer solution Take 3 mLs (0.63 mg total) by nebulization every 6 (six) hours as needed for wheezing. Qty: 360 mL, Refills: 0    levothyroxine (SYNTHROID, LEVOTHROID) 150  MCG tablet Take 1 tablet (150 mcg total) by mouth daily before breakfast. Qty: 90 tablet, Refills: 0    predniSONE (DELTASONE) 50 MG tablet Take 1 tablet (50 mg total) by mouth daily with breakfast. Qty: 5 tablet, Refills: 0      CONTINUE these medications which have CHANGED   Details  albuterol (PROVENTIL HFA;VENTOLIN HFA) 108 (90 Base) MCG/ACT inhaler Inhale 2 puffs  into the lungs every 4 (four) hours as needed for wheezing or shortness of breath. Qty: 18 g, Refills: 1    furosemide (LASIX) 40 MG tablet Take 1 tablet (40 mg total) by mouth daily. Qty: 90 tablet, Refills: 0    naproxen (NAPROSYN) 500 MG tablet Take 1 tablet (500 mg total) by mouth 2 (two) times daily as needed. Qty: 30 tablet, Refills: 0    potassium chloride (KLOR-CON 10) 10 MEQ tablet Take 1 tablet (10 mEq total) by mouth daily. Qty: 90 tablet, Refills: 0      STOP taking these medications     cephALEXin (KEFLEX) 500 MG capsule      HYDROcodone-acetaminophen (NORCO/VICODIN) 5-325 MG tablet      sulfamethoxazole-trimethoprim (BACTRIM DS,SEPTRA DS) 800-160 MG tablet         Today   VITAL SIGNS:  Blood pressure 145/76, pulse 75, temperature 98.4 F (36.9 C), temperature source Oral, resp. rate 20, height 5' (1.524 m), weight 151.501 kg (334 lb), SpO2 98 %.  I/O:   Intake/Output Summary (Last 24 hours) at 08/06/15 1709 Last data filed at 08/06/15 1346  Gross per 24 hour  Intake      0 ml  Output      0 ml  Net      0 ml    PHYSICAL EXAMINATION:  Physical Exam  GENERAL:  45 y.o.-year-old patient lying in the bed with no acute distress. Morbidly obese LUNGS: Normal breath sounds bilaterally, no wheezing, rales,rhonchi or crepitation. No use of accessory muscles of respiration.  CARDIOVASCULAR: S1, S2 normal. No murmurs, rubs, or gallops.  ABDOMEN: Soft, non-tender, non-distended. Bowel sounds present. No organomegaly or mass.  NEUROLOGIC: Moves all 4 extremities. PSYCHIATRIC: The patient is alert and oriented x 3.  SKIN: No obvious rash, lesion, or ulcer.   DATA REVIEW:   CBC  Recent Labs Lab 08/02/15 1049  WBC 10.6  HGB 11.5*  HCT 34.9*  PLT 144*    Chemistries   Recent Labs Lab 08/06/15 0638  NA 137  K 3.5  CL 94*  CO2 33*  GLUCOSE 198*  BUN 39*  CREATININE 1.81*  CALCIUM 9.6    Cardiac Enzymes  Recent Labs Lab 08/02/15 1230   TROPONINI <0.03    Microbiology Results  No results found for this or any previous visit.  RADIOLOGY:  No results found.  Follow up with PCP in 1 week.  Management plans discussed with the patient, family and they are in agreement.  CODE STATUS:     Code Status Orders        Start     Ordered   08/02/15 2031  Full code   Continuous     08/02/15 2030    Code Status History    Date Active Date Inactive Code Status Order ID Comments User Context   This patient has a current code status but no historical code status.      TOTAL TIME TAKING CARE OF THIS PATIENT ON DAY OF DISCHARGE: more than 30 minutes.   Milagros Loll R M.D on 08/06/2015 at  5:09 PM  Between 7am to 6pm - Pager - [phone]  After 6pm go to [email or web address] - password [email or web address] [facility]  [facility]  Office  [phone]  CC: Primary care physician; No PCP Per Patient  Note: This dictation was prepared with Dragon dictation along with smaller phrase technology. Any transcriptional errors that result from this process are unintentional.

## 2015-08-06 NOTE — Progress Notes (Signed)
SATURATION QUALIFICATIONS: (This note is used to comply with regulatory documentation for home oxygen)  Patient Saturations on Room Air at Rest = 88%  Patient Saturations on Room Air while Ambulating = 84%  Patient Saturations on 1 Liters of oxygen while Ambulating =91%  Please briefly explain why patient needs home oxygen: Patient is obese in stature, Hx of Dx of CHF. Patient complains of dizziness

## 2015-08-06 NOTE — Progress Notes (Signed)
Pt sat up in chair for a while this evening with no complaints of shortness of breath or dizziness.

## 2015-08-06 NOTE — Care Management (Signed)
Plan for patient to discharge today.  All prescriptions have been faxed to medication management.  Patient instructed to take hard copy of prescriptions with her at the time of discharge to obtain her medication.  Patient encouraged to completed medication management and [facility] application that has been provided to her.    Home nebulizer ordered.  Jason with [facility] to deliver to room.  Patient has qualified for [facility] O2 through [facility]. Awaiting MD order.  Barbara CowerJason with [facility] notified.  Portable tank to be delivered to room prior to discharge. RNCM signing off.

## 2015-08-06 NOTE — Plan of Care (Signed)
Problem: Safety: Goal: Ability to remain free from injury will improve Outcome: Progressing Pt remaining free from falls this shift.  Problem: Pain Managment: Goal: General experience of comfort will improve Outcome: Progressing Pain control with oral pain medication.

## 2015-08-12 ENCOUNTER — Encounter: Payer: Self-pay | Admitting: Pulmonary Disease

## 2015-08-12 ENCOUNTER — Ambulatory Visit (INDEPENDENT_AMBULATORY_CARE_PROVIDER_SITE_OTHER): Payer: Self-pay | Admitting: Pulmonary Disease

## 2015-08-12 VITALS — BP 136/70 | HR 79 | Ht 60.0 in | Wt 324.0 lb

## 2015-08-12 DIAGNOSIS — R0902 Hypoxemia: Secondary | ICD-10-CM

## 2015-08-12 DIAGNOSIS — E662 Morbid (severe) obesity with alveolar hypoventilation: Secondary | ICD-10-CM

## 2015-08-12 DIAGNOSIS — J449 Chronic obstructive pulmonary disease, unspecified: Secondary | ICD-10-CM

## 2015-08-12 DIAGNOSIS — I2781 Cor pulmonale (chronic): Secondary | ICD-10-CM

## 2015-08-12 DIAGNOSIS — E039 Hypothyroidism, unspecified: Secondary | ICD-10-CM

## 2015-08-12 NOTE — Progress Notes (Signed)
PULMONARY POST HOSPITAL FOLLOW UP Hospitalized 05/27-05/31/17 for decompensated OHS and myxedema manifesting as severe LE edema, possible cellulitis, acute on chronic hypoxic respiratory failure. Underwent diuresis. Levothyroxine restarted. 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 Levothyroxine re-initiated   INTERVAL HISTORY: No major events since discharge  SUBJ: Feels much better since discharge - more energy, improving LE edema. No new complaints. Working on weight loss. Wearing her O2 compliantly.   OBJ: Filed Vitals:   08/12/15 1113  BP: 136/70  Pulse: 79  Height: 5' (1.524 m)  Weight: 324 lb (146.965 kg)  SpO2: 95%   Obese, NAD, cognition intact HEENT WNL JVP cannot be visualized BS full, no wheezes RRR s M Very obese, nontender 2-3+ symmetric pitting and brawny LE edema with chronic stasis changes No focal neurological deficits   DATA: BMP Latest Ref Rng 08/06/2015 08/05/2015 08/02/2015  Glucose 65 - 99 mg/dL 045(W198(H) - 098(J145(H)  BUN 6 - 20 mg/dL 19(J39(H) - 47(W31(H)  Creatinine 0.44 - 1.00 mg/dL 2.95(A1.81(H) 2.13(Y1.85(H) 8.65(H1.60(H)  Sodium 135 - 145 mmol/L 137 - 137  Potassium 3.5 - 5.1 mmol/L 3.5 - 4.3  Chloride 101 - 111 mmol/L 94(L) - 99(L)  CO2 22 - 32 mmol/L 33(H) - 27  Calcium 8.9 - 10.3 mg/dL 9.6 - 9.8    CBC Latest Ref Rng 08/02/2015 05/19/2015 05/07/2015  WBC 3.6 - 11.0 K/uL 10.6 9.7 10.0  Hemoglobin 12.0 - 16.0 g/dL 11.5(L) 11.1(L) 11.9(L)  Hematocrit 35.0 - 47.0 % 34.9(L) 33.7(L) 37.1  Platelets 150 - 440 K/uL 144(L) 286 209   CT chest 05/07/15: NACPD CXR (08/04/15): NACPD    IMPRESSION: Morbid obesity with alveolar hypoventilation (HCC)  Hypoxemia  Cor pulmonale (chronic) (HCC)  Hypothyroidism, unspecified hypothyroidism  type  Chronic obstructive pulmonary disease, unspecified COPD type (HCC) - Plan: AMB REFERRAL FOR DME    PLAN: Cont supplemental O2 Cont levothyroxine We discussed @ length weight loss strategies - no simple sugars or CHOs, single ingredient foods, etc ROV 3 months with target weight of 305#   Connie Fischeravid Roan Miklos, MD PCCM service Mobile [phone] Pager [phone] 08/12/2015

## 2015-08-20 ENCOUNTER — Telehealth: Payer: Self-pay | Admitting: Pulmonary Disease

## 2015-08-20 NOTE — Telephone Encounter (Signed)
Pt is calling asking if she will be on oxygen for the next 3 months until her next appt. Please call and advise.

## 2015-08-20 NOTE — Telephone Encounter (Signed)
LMOM for pt that per DS note she will need to cont O2 until f/u visit. Call back with any other questions. Nothing further needed.

## 2015-08-26 ENCOUNTER — Ambulatory Visit: Payer: Self-pay

## 2015-11-13 ENCOUNTER — Ambulatory Visit: Payer: Self-pay | Admitting: Pulmonary Disease

## 2016-10-29 IMAGING — US US EXTREM LOW VENOUS BILAT
1 series · 13 of 24 positions shown · non-contrast
Comparison: None.

CLINICAL DATA: Three-week history of bilateral lower extremity
edema

EXAM:
BILATERAL LOWER EXTREMITY VENOUS DUPLEX ULTRASOUND
TECHNIQUE: Gray-scale sonography with graded compression, as well as color
Doppler and duplex ultrasound were performed to evaluate the lower
extremity deep venous systems from the level of the common femoral
vein and including the common femoral, femoral, profunda femoral,
popliteal and calf veins including the posterior tibial, peroneal
and gastrocnemius veins when visible. The superficial great
saphenous vein was also interrogated. Spectral Doppler was utilized
to evaluate flow at rest and with distal augmentation maneuvers in
the common femoral, femoral and popliteal veins.

[Series 1: us extrem low venous bilat · 0.08mm/px · 13 of 60 slices shown]
[im 1/60]
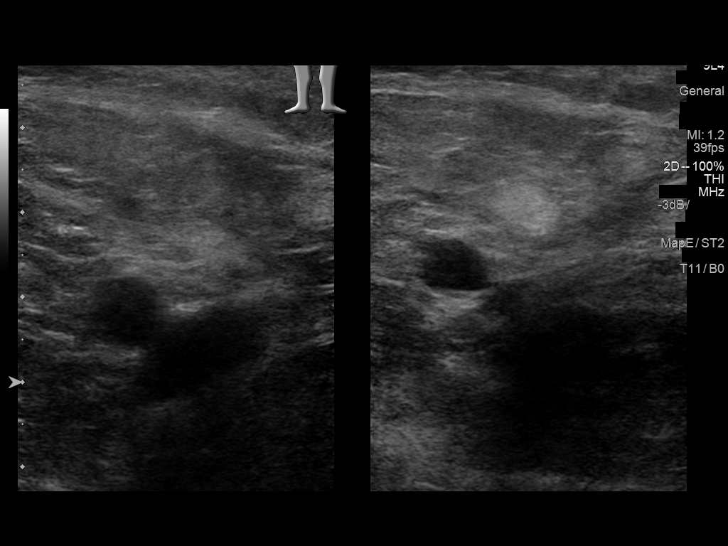
[im 6/60]
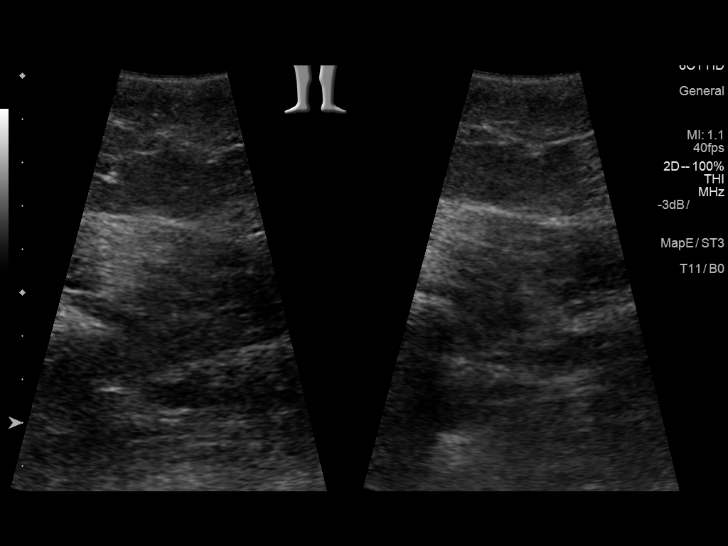
[im 11/60]
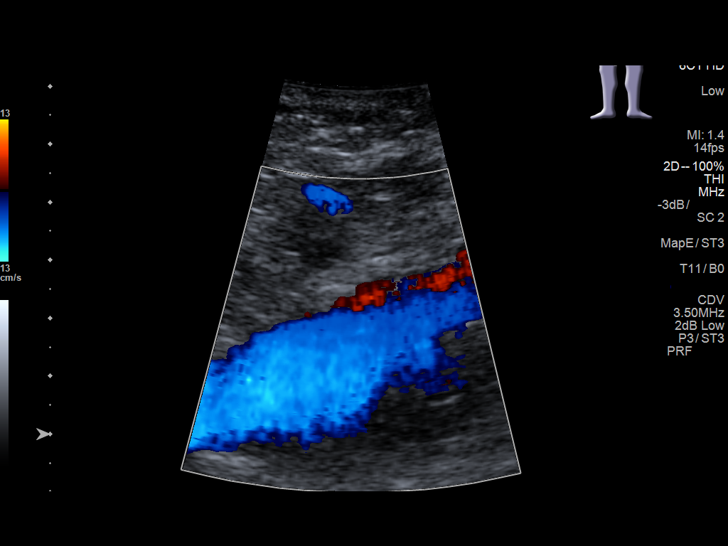
[im 16/60]
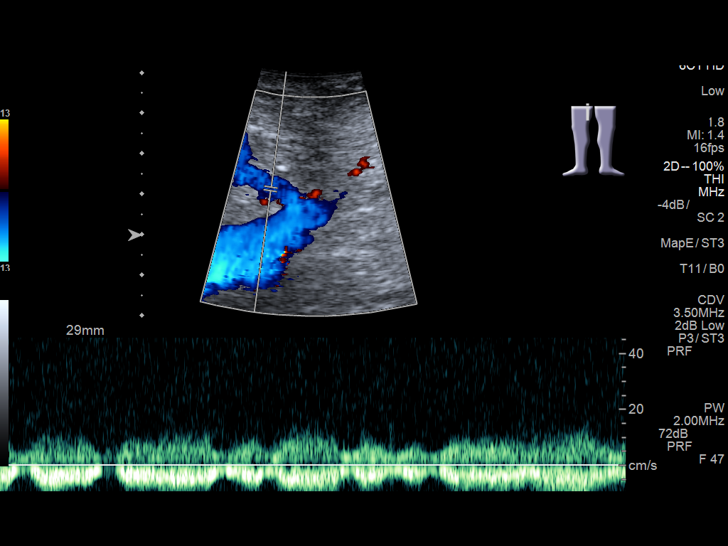
[im 21/60]
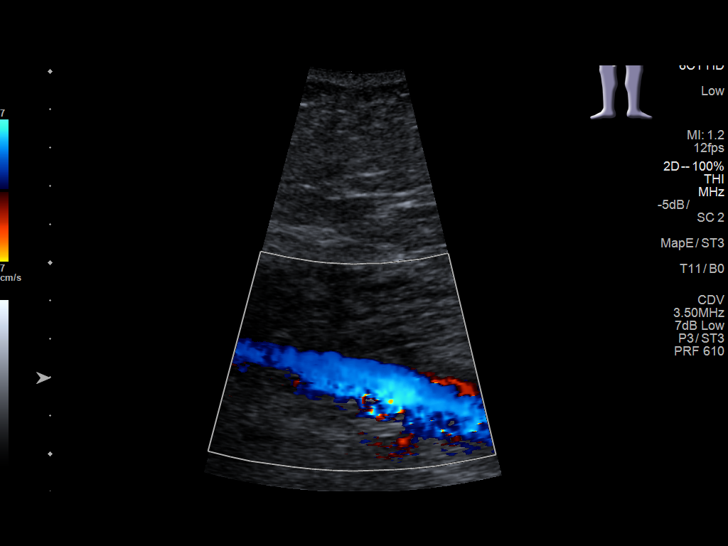
[im 26/60]
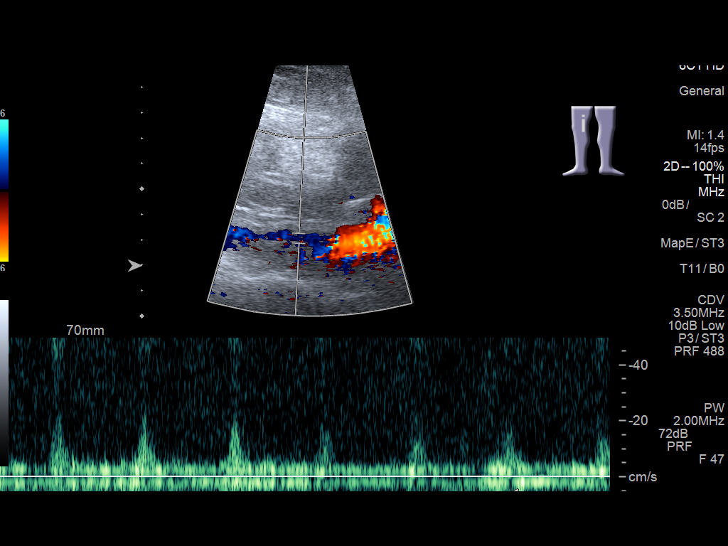
[im 31/60]
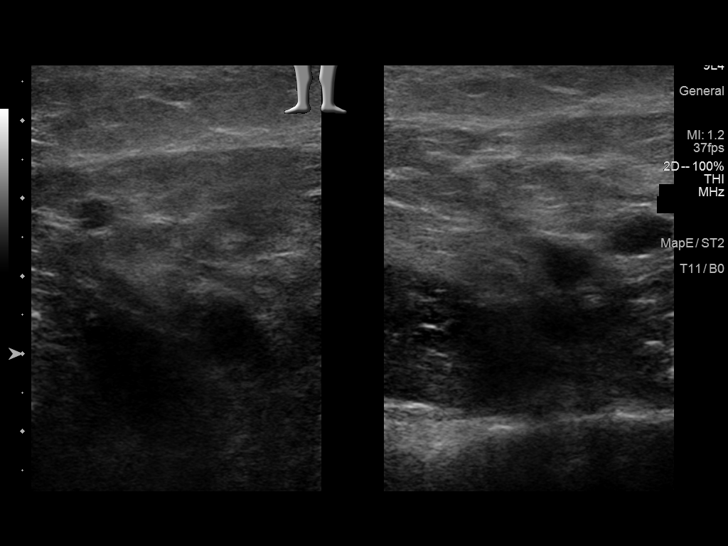
[im 34/60]
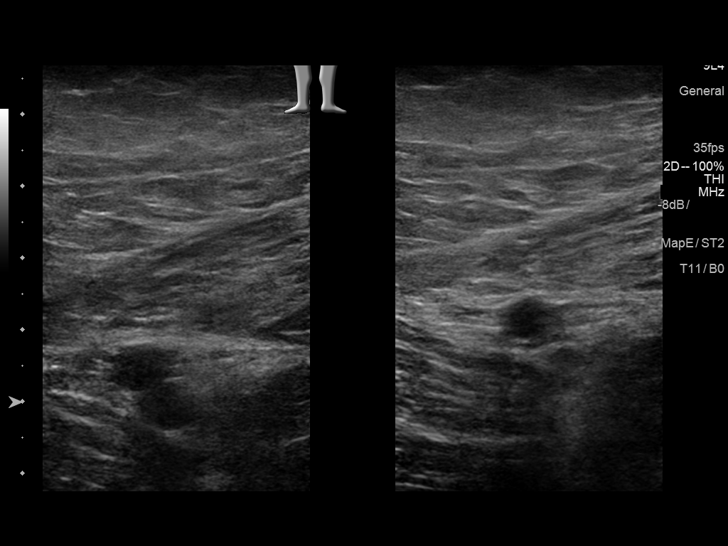
[im 39/60]
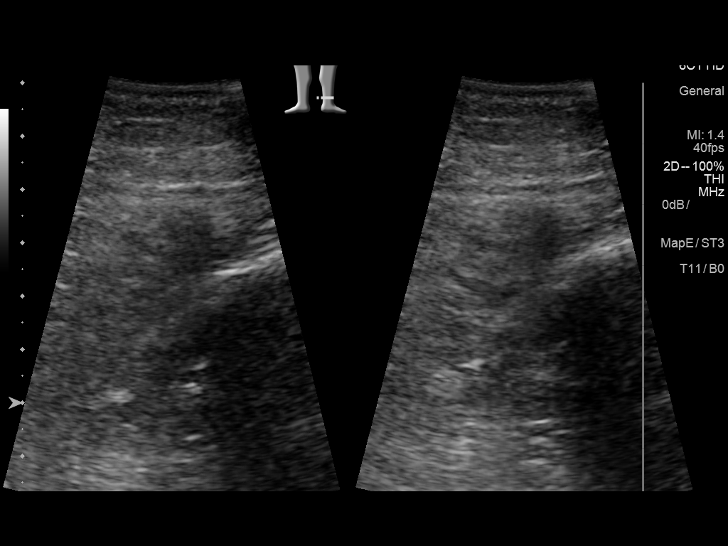
[im 44/60]
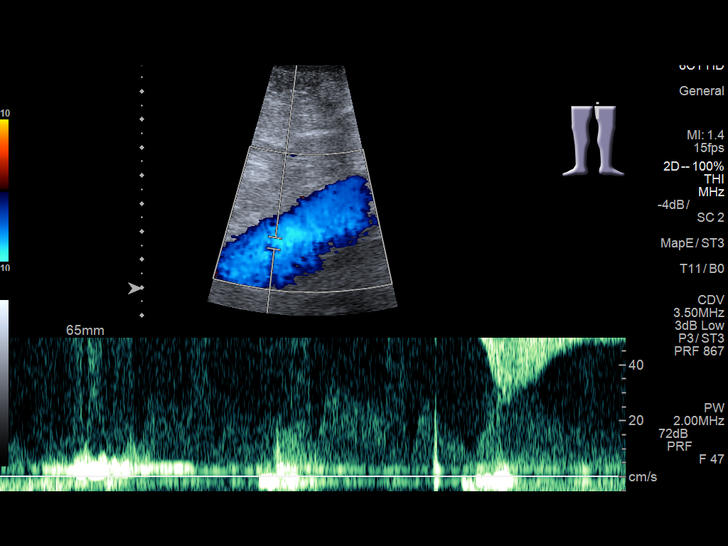
[im 49/60]
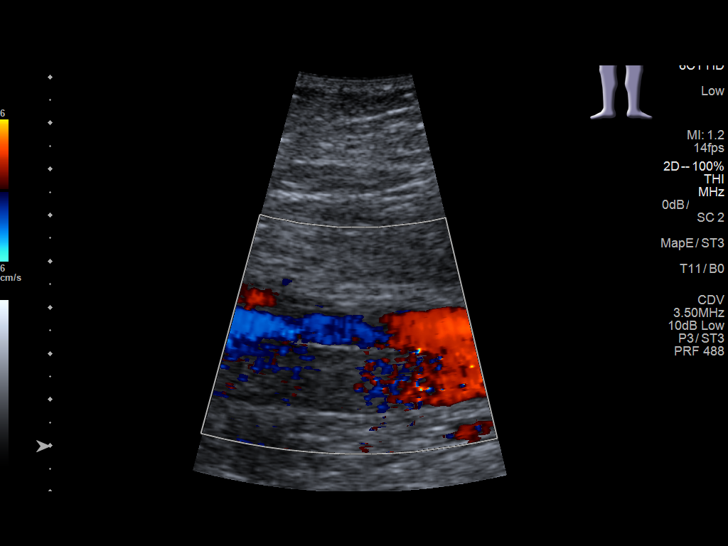
[im 54/60]
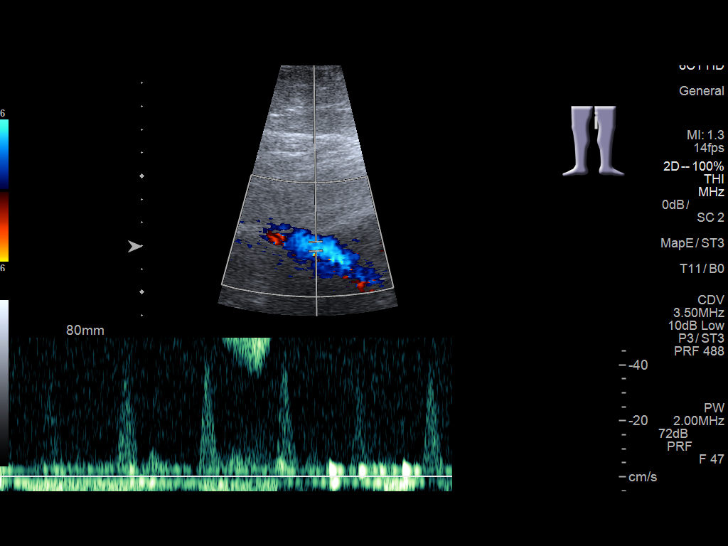
[im 60/60]
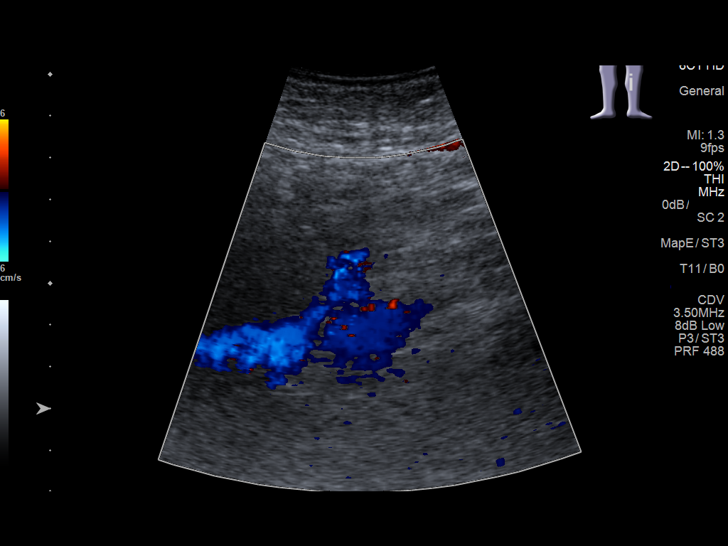

[13 of 24 positions shown; findings below may reference images not displayed]

FINDINGS: RIGHT LOWER EXTREMITY

Common Femoral Vein: No evidence of thrombus. Normal
compressibility, respiratory phasicity and response to augmentation.

Saphenofemoral Junction: No evidence of thrombus. Normal
compressibility and flow on color Doppler imaging.

Profunda Femoral Vein: No evidence of thrombus. Normal
compressibility and flow on color Doppler imaging.

Femoral Vein: No evidence of thrombus. Normal compressibility,
respiratory phasicity and response to augmentation.

Popliteal Vein: No evidence of thrombus. Normal compressibility,
respiratory phasicity and response to augmentation.

Calf Veins: No evidence of thrombus. Normal compressibility and flow
on color Doppler imaging.

Superficial Great Saphenous Vein: No evidence of thrombus. Normal
compressibility and flow on color Doppler imaging.

Venous Reflux:  None.

Other Findings:  None.

LEFT LOWER EXTREMITY

Common Femoral Vein: No evidence of thrombus. Normal
compressibility, respiratory phasicity and response to augmentation.

Saphenofemoral Junction: No evidence of thrombus. Normal
compressibility and flow on color Doppler imaging.

Profunda Femoral Vein: No evidence of thrombus. Normal
compressibility and flow on color Doppler imaging.

Femoral Vein: No evidence of thrombus. Normal compressibility,
respiratory phasicity and response to augmentation.

Popliteal Vein: No evidence of thrombus. Normal compressibility,
respiratory phasicity and response to augmentation.

Calf Veins: No evidence of thrombus. Normal compressibility and flow
on color Doppler imaging.

Superficial Great Saphenous Vein: No evidence of thrombus. Normal
compressibility and flow on color Doppler imaging.

Venous Reflux:  None.

Other Findings:  None.
IMPRESSION: No evidence of deep venous thrombosis in either lower extremity.

## 2017-01-14 IMAGING — US US EXTREM LOW VENOUS BILAT
1 series · 14 of 24 positions shown · non-contrast
Comparison: 05/19/2015

CLINICAL DATA: Edema x1 month

EXAM:
BILATERAL LOWER EXTREMITY VENOUS DOPPLER ULTRASOUND
TECHNIQUE: Gray-scale sonography with compression, as well as color and duplex
ultrasound, were performed to evaluate the deep venous system from
the level of the common femoral vein through the popliteal and
proximal calf veins. Technologist describes technical limitations of
exam secondary to body habitus.

[Series 1: us extrem low venous bilat · 0.09mm/px · 14 of 54 slices shown]
[im 1/54]
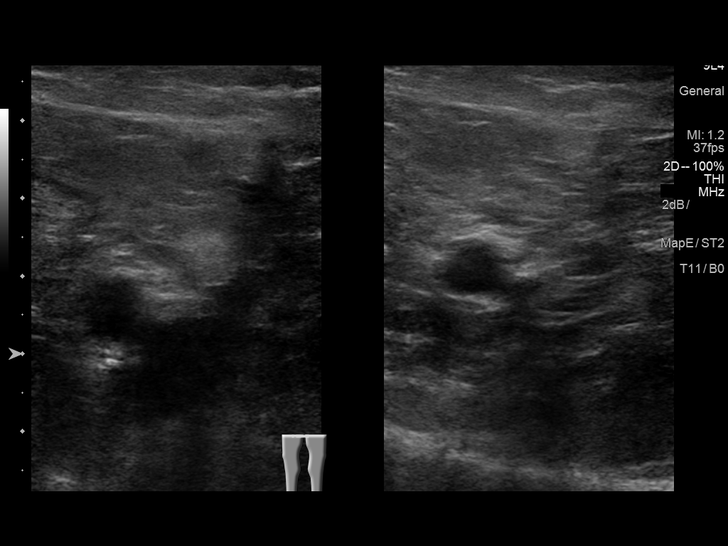
[im 5/54]
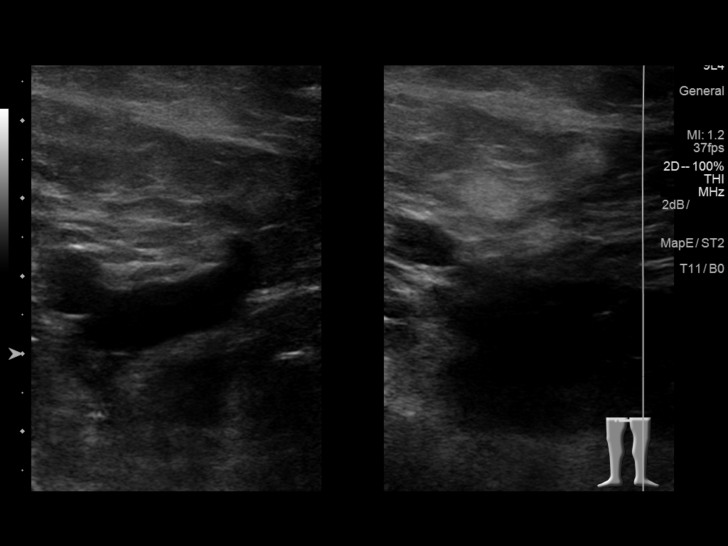
[im 10/54]
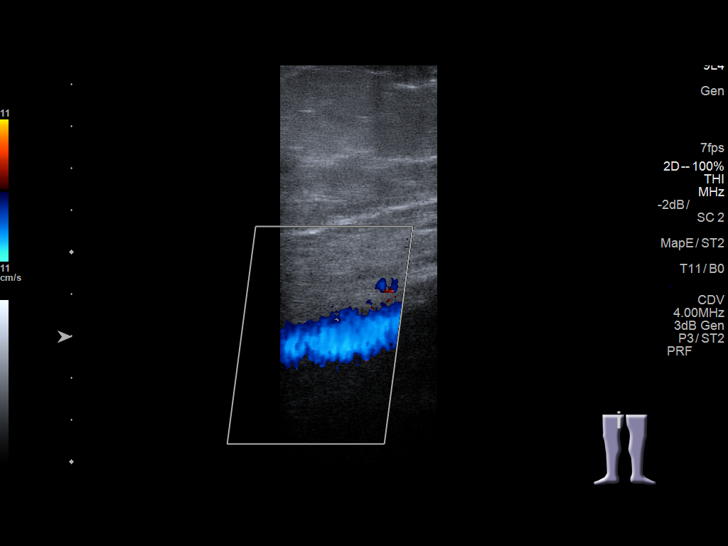
[im 14/54]
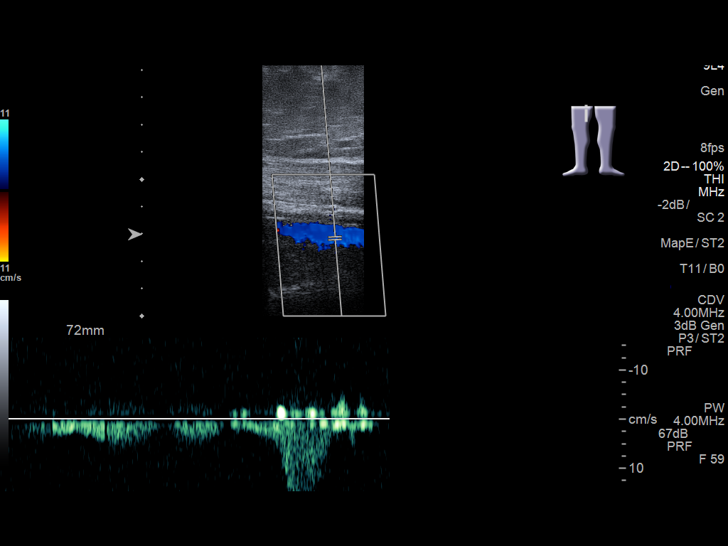
[im 17/54]
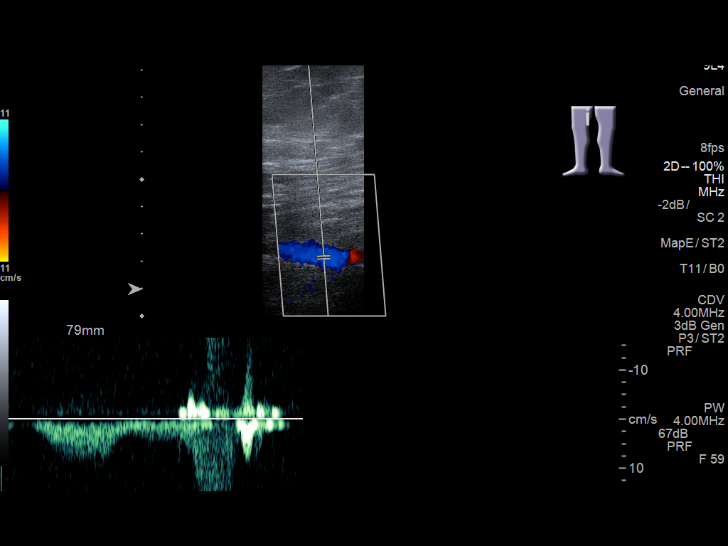
[im 21/54]
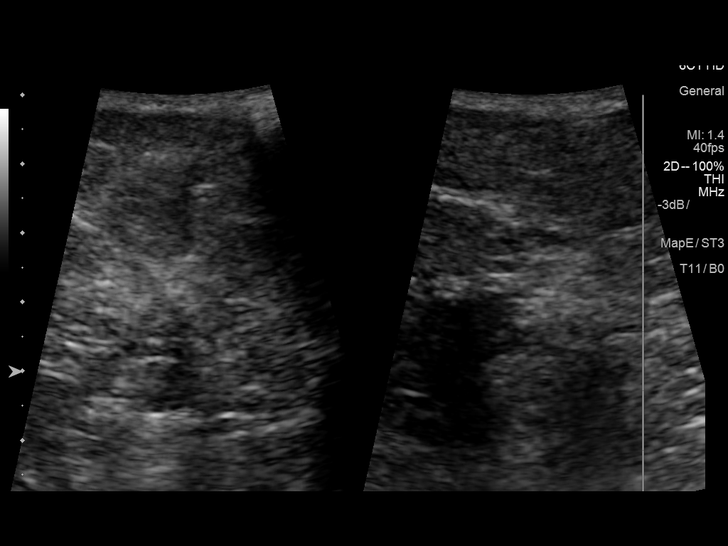
[im 26/54]
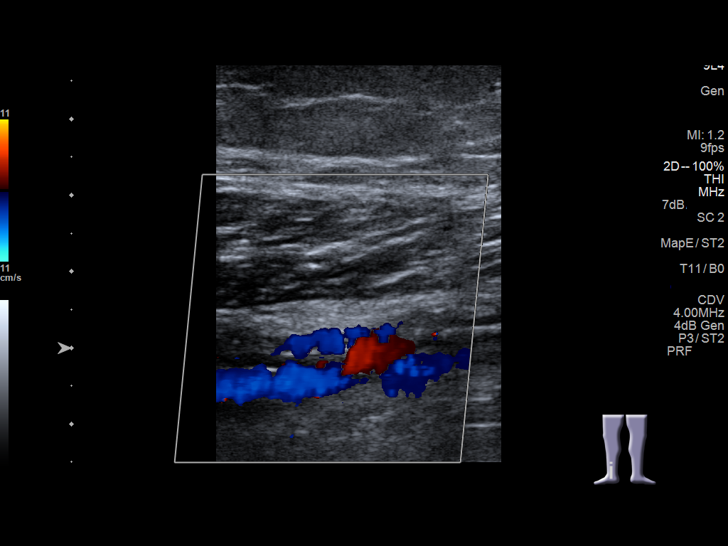
[im 28/54]
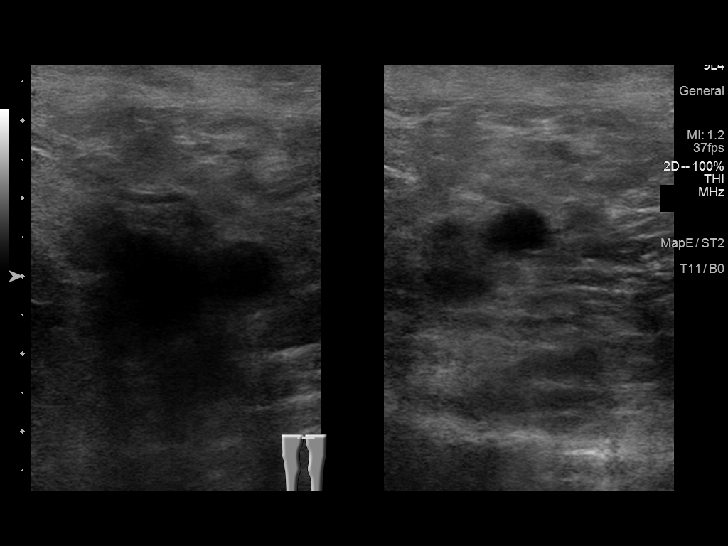
[im 33/54]
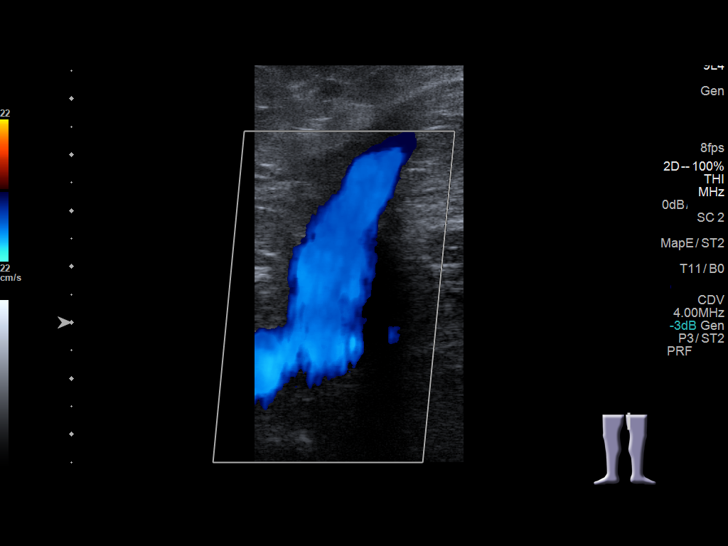
[im 37/54]
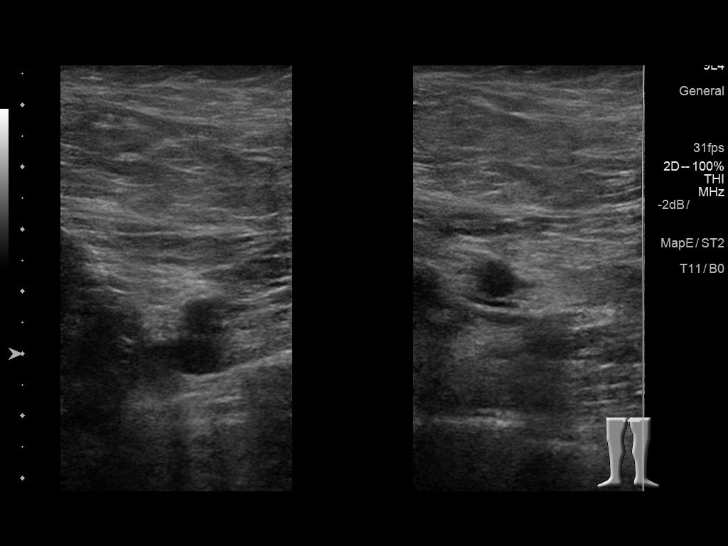
[im 42/54]
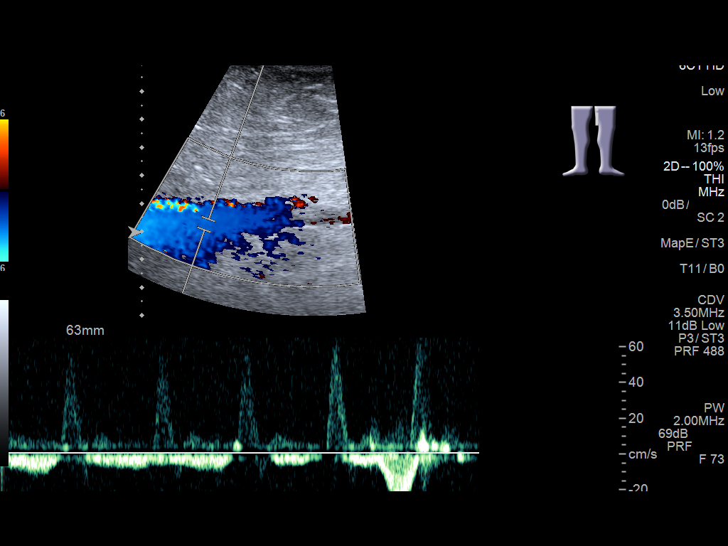
[im 44/54]
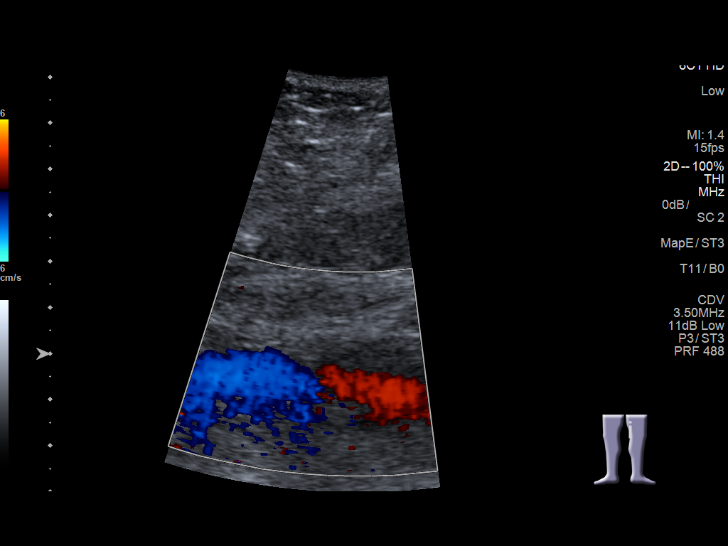
[im 49/54]
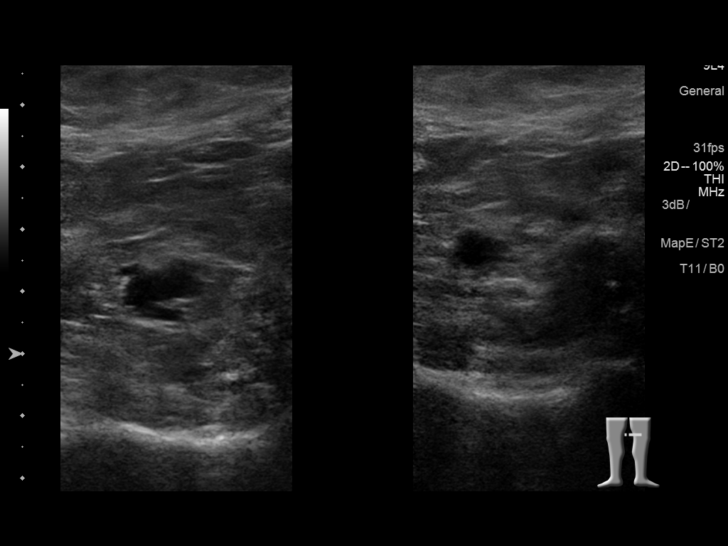
[im 54/54]
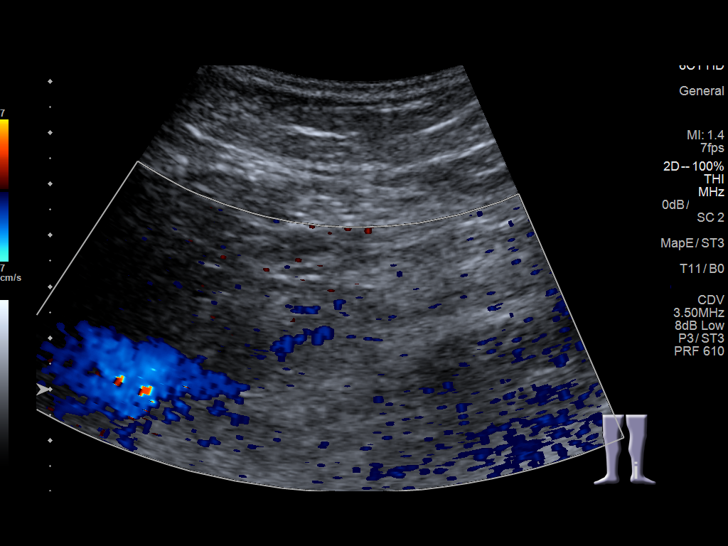

[14 of 24 positions shown; findings below may reference images not displayed]

FINDINGS: Normal compressibility of the common femoral, superficial femoral,
and popliteal veins, as well as the proximal calf veins. No filling
defects to suggest DVT on grayscale or color Doppler imaging.
Doppler waveforms show normal direction of venous flow, normal
respiratory phasicity and response to augmentation.
IMPRESSION: No evidence of  lower extremity deep vein thrombosis, bilaterally.
